# Patient Record
Sex: Female | Born: 1950 | Race: White | Hispanic: No | Marital: Married | Smoking: Never smoker
Health system: Southern US, Community
[De-identification: ages and names within clinical notes are randomized; demographics above are authoritative.]

## PROBLEM LIST (undated history)

## (undated) DIAGNOSIS — L409 Psoriasis, unspecified: Secondary | ICD-10-CM

## (undated) DIAGNOSIS — E0500 Thyrotoxicosis with diffuse goiter without thyrotoxic crisis or storm: Secondary | ICD-10-CM

## (undated) HISTORY — PX: BREAST EXCISIONAL BIOPSY: SUR124

## (undated) HISTORY — DX: Thyrotoxicosis with diffuse goiter without thyrotoxic crisis or storm: E05.00

---

## 1999-06-09 ENCOUNTER — Other Ambulatory Visit: Admission: RE | Admit: 1999-06-09 | Discharge: 1999-06-09 | Payer: Self-pay | Admitting: Obstetrics and Gynecology

## 2000-01-06 ENCOUNTER — Encounter: Payer: Self-pay | Admitting: Obstetrics and Gynecology

## 2000-01-06 ENCOUNTER — Encounter: Admission: RE | Admit: 2000-01-06 | Discharge: 2000-01-06 | Payer: Self-pay | Admitting: Obstetrics and Gynecology

## 2001-01-10 ENCOUNTER — Encounter: Payer: Self-pay | Admitting: Family Medicine

## 2001-01-10 ENCOUNTER — Encounter: Admission: RE | Admit: 2001-01-10 | Discharge: 2001-01-10 | Payer: Self-pay | Admitting: Family Medicine

## 2001-12-20 ENCOUNTER — Encounter (INDEPENDENT_AMBULATORY_CARE_PROVIDER_SITE_OTHER): Payer: Self-pay | Admitting: Specialist

## 2001-12-20 ENCOUNTER — Ambulatory Visit (HOSPITAL_COMMUNITY): Admission: RE | Admit: 2001-12-20 | Discharge: 2001-12-20 | Payer: Self-pay | Admitting: *Deleted

## 2002-01-17 ENCOUNTER — Encounter: Payer: Self-pay | Admitting: Family Medicine

## 2002-01-17 ENCOUNTER — Encounter: Admission: RE | Admit: 2002-01-17 | Discharge: 2002-01-17 | Payer: Self-pay | Admitting: Family Medicine

## 2003-01-30 ENCOUNTER — Encounter: Payer: Self-pay | Admitting: Family Medicine

## 2003-01-30 ENCOUNTER — Encounter: Admission: RE | Admit: 2003-01-30 | Discharge: 2003-01-30 | Payer: Self-pay | Admitting: Family Medicine

## 2004-02-05 ENCOUNTER — Encounter: Admission: RE | Admit: 2004-02-05 | Discharge: 2004-02-05 | Payer: Self-pay | Admitting: Family Medicine

## 2004-07-12 ENCOUNTER — Inpatient Hospital Stay (HOSPITAL_COMMUNITY): Admission: AD | Admit: 2004-07-12 | Discharge: 2004-07-14 | Payer: Self-pay | Admitting: Family Medicine

## 2004-07-29 ENCOUNTER — Encounter: Admission: RE | Admit: 2004-07-29 | Discharge: 2004-10-27 | Payer: Self-pay | Admitting: Obstetrics and Gynecology

## 2005-07-21 ENCOUNTER — Encounter: Admission: RE | Admit: 2005-07-21 | Discharge: 2005-07-21 | Payer: Self-pay | Admitting: Family Medicine

## 2006-07-27 ENCOUNTER — Encounter: Admission: RE | Admit: 2006-07-27 | Discharge: 2006-07-27 | Payer: Self-pay | Admitting: Family Medicine

## 2007-08-23 ENCOUNTER — Encounter: Admission: RE | Admit: 2007-08-23 | Discharge: 2007-08-23 | Payer: Self-pay | Admitting: Family Medicine

## 2008-07-22 ENCOUNTER — Encounter: Admission: RE | Admit: 2008-07-22 | Discharge: 2008-07-22 | Payer: Self-pay | Admitting: Family Medicine

## 2008-10-10 ENCOUNTER — Encounter: Admission: RE | Admit: 2008-10-10 | Discharge: 2008-10-10 | Payer: Self-pay | Admitting: Family Medicine

## 2009-07-27 ENCOUNTER — Encounter: Admission: RE | Admit: 2009-07-27 | Discharge: 2009-07-27 | Payer: Self-pay | Admitting: Family Medicine

## 2010-07-29 ENCOUNTER — Encounter: Admission: RE | Admit: 2010-07-29 | Discharge: 2010-07-29 | Payer: Self-pay | Admitting: Family Medicine

## 2011-03-11 NOTE — Discharge Summary (Signed)
NAMECERIA, Dana Munoz              ACCOUNT NO.:  [id number]   MEDICAL RECORD NO.:  [id number]          PATIENT TYPE:  INP   LOCATION:  5020                         FACILITY:  [facility]   PHYSICIAN:  Hollice Espy, M.D.DATE OF BIRTH:  05-06-1951   DATE OF ADMISSION:  07/12/2004  DATE OF DISCHARGE:  07/14/2004                                 DISCHARGE SUMMARY   PRIMARY CARE PHYSICIAN:  Caryn Bee L. Little, M.D.   DISCHARGE DIAGNOSES:  1.  Right lower lobe and right upper lobe pneumonia.  2.  Fatty liver.  3.  History of irritable bowel syndrome.  4.  Psoriasis.   DISCHARGE MEDICATIONS:  Avelox 400 mg p.o. daily.   HISTORY OF PRESENT ILLNESS:  The patient is a 60 year old white female with  past medical history of psoriasis who came into see her primary care  physician on July 12, 2004 after having problems with nausea and  vomiting.  Previously for the few days prior, she had been complaining of  feeling feverish with temperature which noted to be 101 she took herself.  Originally she had been just diagnosed with possibly sinus or allergy-  related problems with associated nausea and vomiting.  However, by July 12, 2004 after evaluation by Dr. Fredirick Maudlin covering partner, Dr. Tenny Craw, who  felt the patient had a pneumonia.  She was noted to have crackles on her  right lower lobe and upper lobe.   HOSPITAL COURSE:  The patient was brought into the emergency room.  A chest  x-ray was done which did indeed confirm the presence of pneumonia.  She was  started on IV antibiotics.  In addition, she was noted to be dehydrated with  a sodium of 129 and potassium at 3.2.  An ABG done noted a PO2 of 60%.  The  rest of her ABG was normal.  The patient was felt to be hypoxic secondary to  her pneumonia.  She received oxygen, breathing treatments, antibiotics, and  IV fluid.  She responded very well and by July 13, 2004 was starting to  feel much better.  Her only complaint was some  shakiness overall after  getting a breathing treatment.  She was able to be changed over to p.o.  antibiotics on July 13, 2004 and she was able to start standing on her  own without any dizziness.  By July 14, 2004 she was able to ambulate  well, tolerate p.o.  A repeat ABG showed a PO2 had improved up to 80%.  At  this time it was felt that she was stable enough to be discharged home.   She will continue on five more days antibiotics for a total of seven days.  At the time she will follow up with her PCP and at his discretion, a repeat  chest x-ray may be obtained.  In addition, the patient's saturations are  above 95% on room air.   In regard to her fatty liver, she was complaining of some right upper  quadrant pain which she said was worse when eating.  She is status post  cholecystectomy.  A CT  scan of her abdomen and pelvis done was remarkable  only for a diffuse fatty liver.  Initially when the patient came to the  emergency room, she was noted to have an elevated AST and ALT of 51 and 57.  A repeat AST and ALT on July 14, 2004 had slightly increased to 74 and  73, but given the patient's absence of pain and no other symptoms, this may  be a transient inflammation secondary to her pneumonia and likely bears  followup.  The patient is advised to decrease her alcohol intake.  She tells  me that she drinks occasionally on weekends but not every weekend, no more  than two or three drinks.  I advised the patient that she needs to decrease  even this alcohol intake as well as go on more of a low-fat diet for weight  loss given that the risks of fatty liver can lead to long term possible  hepatitis.  The patient understands this and she tells me she will indeed be  compliant.   She is being discharged to home.  Her disposition has improved.  Her  activity is as tolerated.  Her diet is recommended to be a low-fat diet.  The patient will follow up with her PCP, Dr. Clarene Duke,  in one week's time.      Send   SKK/MEDQ  D:  07/14/2004  T:  07/15/2004  Job:  [id number]   cc:   Caryn Bee L. Little, M.D.  [address]  [place]  [place] [address]  Fax: [phone]

## 2011-03-11 NOTE — H&P (Signed)
NAMEMERON, BOCCHINO              ACCOUNT NO.:  [id number]   MEDICAL RECORD NO.:  [id number]          PATIENT TYPE:  INP   LOCATION:  5020                         FACILITY:  [facility]   PHYSICIAN:  Melissa L. Ladona Ridgel, MD  DATE OF BIRTH:  Jun 26, 1951   DATE OF ADMISSION:  07/12/2004  DATE OF DISCHARGE:                                HISTORY & PHYSICAL   CHIEF COMPLAINT:  Nausea, vomiting and fever.   PRIMARY CARE PHYSICIAN:  Caryn Bee L. Little, M.D.   HISTORY OF PRESENT ILLNESS:  The patient is a 60 year old white female who  states that on Thursday she started to feel feverish.  She took her  temperature and found it to be subtherapeutic at 97.5 degrees.  She treated  herself with rest and by Friday again felt hot with chills and found that  she had a temperature in the 101.0 range for which she took Tylenol.  She  continued to have fever and bone pain as well as muscles aches and pain for  which she was alternating Tylenol and Motrin.  By Sunday, she was nauseous  and vomiting and so she went to the walk-in clinic.  There she was given an  unknown liquid to drink which did not help her symptoms and by Monday she  continued to have severe body aches, chills, nausea and vomiting so much so  that she could not even keep ginger ale down.  She went to the primary care  physician's office and was seen by Dr. Tenny Craw who gave her a sample of Ketek  and asked her to go home and rest.  The patient went home and took her first  dose of Ketek and started to vomit terribly.  Because of her inability to  keep any medications down and her persistent fever, the patient was admitted  directly to the medical floor for treatment of dehydration and probable  pneumonia.  The patient gives a history of her spouse recently returning  from a five week trip in [place] where he in and out of factory settings  amongst the people.  He received different types of advisories for potential  exposures including meningitis,  but states that he was not in any way in  contact with animals as part of his work.  He has presently been back home  about three weeks. The only  sick contacts that the patient had exposure to  was her grandchildren.   REVIEW OF SYMPTOMS:  GENERAL:  Fevers, body aches, headache and negative  cough. HEENT: Positive sinus drainage. All other review of systems are  negative including no dysuria, no melena and no hematochezia.   PAST MEDICAL HISTORY:  1.  Psoriasis.  2.  Occasional irritable bowel symptoms.   PAST SURGICAL HISTORY:  1.  Hysterectomy.  2.  Several lumpectomies on both breasts.  3.  Gallbladder has been removed.  4.  Basal cell carcinoma removed from her back and her arm.  5.  Toenails removed in the past.   ALLERGIES:  1.  PENICILLIN.  2.  NEOSPORIN.  3.  MACRODANTIN.   MEDICATIONS:  1.  Black cohosh.  2.  Fish oil.  3.  Recently started lactobacillus to try to help with her irritable bowel      syndrome.   SOCIAL HISTORY:  She quit tobacco probably greater than 20 years ago.  She  does drink approximately several times a week and that consists of several  vodkas.  She denies any illicit drug abuse.  She is an Print production planner for a  dentist.  She has one step-son with grandchildren.  She is married.   FAMILY HISTORY:  Mom is living with hypertension and hip replacement.  Her  father is deceased secondary to suicide.  She is not aware of any medical  problems.  She also related some dizziness.   PHYSICAL EXAMINATION:  VITAL SIGNS:  Temperature is 103.6, blood pressure  146/73, initial heart rate was 106 but on my exam is about 96, respiratory  rate 22 on admission and appears to be about 18 for me, saturations are 96%  on room air.  GENERAL:  This is an ill appearing white female.  HEENT:  Normocephalic, atraumatic.  Pupils equal, round and reactive to  light.  Extraocular movements are intact.  Mucous membranes are dry. The  neck is supple.  There is no JVD.   No lymph nodes.  No carotid bruits.  Her  TM reveal serous otitis. She does have psoriatic changes to the posterior  auricular area.  CHEST:  Right mid axillary crackles, but no wheezes.  CARDIOVASCULAR:  Regular rate and rhythm with positive S1 and S2.  No S3 or  S4.  No murmurs, rubs or gallops.  ABDOMEN:  Soft, non-tender and non-distended with positive bowel sounds.  EXTREMITIES:  No cyanosis, clubbing or edema.  She does have psoriatic  plaques to the backs of her elbows.  NEUROLOGIC:  She is nonfocal. Power is 4/5 and symmetrical.  Deep tendon  reflexes are 2+.  Cranial nerves appear to be intact.   LABORATORY DATA:  Pending.   X-ray is pending.   ASSESSMENT AND PLAN:  This is a 60 year old white female with several days  of fever, chills, nausea and vomiting progressing on to a cough with  greenish sputum.  She has been admitted to the hospital for failure of  outpatient therapy secondary to intractable nausea preventing her from  taking pills.  1.  Pulmonary.  We will check her chest x-ray and sinus x-rays. I agree with      ceftriaxone and Azithromycin IV at this time since she is not taking      pills very well. I will add nebulizers and a flutter valve and check an      arterial blood gas to be sure she is not hypoxic.  2.  Cardiovascular.  She arrived mildly tachycardic, but appears to be      settling down. I suspect this is secondary to dehydration. Her dizziness      may also be orthostasis, so we will check orthostatic blood pressures.  3.  Gastrointestinal.  We will treat her nausea and vomiting with Phenergan      and Zofran and add Protonix. She can have clears and we will advance as      tolerated her diet.  4.  Genito-urinary.  We will check a urinalysis, culture and sensitivity.  5.  Deep venous thrombosis prophylaxis. At this point, the patient is able     to ambulate and we will therefore use ambulation and PAS hose for deep  venous thrombosis  prophylaxis.  If she seems to be more in bed than      ambulating, then we will switch to Lovenox.        MLT/MEDQ  D:  07/12/2004  T:  07/12/2004  Job:  [id number]   cc:   Caryn Bee L. Little, M.D.  [address]  [place]  [place] [address]  Fax: [phone]

## 2011-06-23 ENCOUNTER — Other Ambulatory Visit: Payer: Self-pay | Admitting: Family Medicine

## 2011-06-23 DIAGNOSIS — Z1231 Encounter for screening mammogram for malignant neoplasm of breast: Secondary | ICD-10-CM

## 2011-08-02 ENCOUNTER — Ambulatory Visit
Admission: RE | Admit: 2011-08-02 | Discharge: 2011-08-02 | Disposition: A | Discharge status: Home / Self Care | Payer: 59 | Source: Ambulatory Visit | Attending: Family Medicine | Admitting: Family Medicine

## 2011-08-02 DIAGNOSIS — Z1231 Encounter for screening mammogram for malignant neoplasm of breast: Secondary | ICD-10-CM

## 2012-06-26 ENCOUNTER — Other Ambulatory Visit: Payer: Self-pay | Admitting: Family Medicine

## 2012-06-26 DIAGNOSIS — Z1231 Encounter for screening mammogram for malignant neoplasm of breast: Secondary | ICD-10-CM

## 2012-08-21 ENCOUNTER — Ambulatory Visit
Admission: RE | Admit: 2012-08-21 | Discharge: 2012-08-21 | Disposition: A | Discharge status: Home / Self Care | Payer: 59 | Source: Ambulatory Visit | Attending: Family Medicine | Admitting: Family Medicine

## 2012-08-21 DIAGNOSIS — Z1231 Encounter for screening mammogram for malignant neoplasm of breast: Secondary | ICD-10-CM

## 2013-07-24 ENCOUNTER — Other Ambulatory Visit: Payer: Self-pay

## 2013-07-24 DIAGNOSIS — Z1231 Encounter for screening mammogram for malignant neoplasm of breast: Secondary | ICD-10-CM

## 2013-08-22 ENCOUNTER — Ambulatory Visit
Admission: RE | Admit: 2013-08-22 | Discharge: 2013-08-22 | Disposition: A | Discharge status: Home / Self Care | Payer: 59 | Source: Ambulatory Visit

## 2013-08-22 DIAGNOSIS — Z1231 Encounter for screening mammogram for malignant neoplasm of breast: Secondary | ICD-10-CM

## 2013-10-31 ENCOUNTER — Other Ambulatory Visit (HOSPITAL_COMMUNITY): Payer: Self-pay | Admitting: Internal Medicine

## 2013-10-31 DIAGNOSIS — E041 Nontoxic single thyroid nodule: Secondary | ICD-10-CM

## 2013-10-31 DIAGNOSIS — E0590 Thyrotoxicosis, unspecified without thyrotoxic crisis or storm: Secondary | ICD-10-CM

## 2013-11-14 ENCOUNTER — Encounter (HOSPITAL_COMMUNITY)
Admission: RE | Admit: 2013-11-14 | Discharge: 2013-11-14 | Disposition: A | Discharge status: Home / Self Care | Payer: 59 | Source: Ambulatory Visit | Attending: Internal Medicine | Admitting: Internal Medicine

## 2013-11-14 DIAGNOSIS — E041 Nontoxic single thyroid nodule: Secondary | ICD-10-CM

## 2013-11-14 DIAGNOSIS — E0590 Thyrotoxicosis, unspecified without thyrotoxic crisis or storm: Secondary | ICD-10-CM | POA: Insufficient documentation

## 2013-11-15 ENCOUNTER — Encounter (HOSPITAL_COMMUNITY)
Admission: RE | Admit: 2013-11-15 | Discharge: 2013-11-15 | Disposition: A | Discharge status: Home / Self Care | Payer: 59 | Source: Ambulatory Visit | Attending: Internal Medicine | Admitting: Internal Medicine

## 2013-11-15 MED ORDER — SODIUM PERTECHNETATE TC 99M INJECTION
11.0000 | Freq: Once | INTRAVENOUS | Status: AC | PRN
  Administered 2013-11-15: 11 via INTRAVENOUS

## 2013-11-15 MED ORDER — SODIUM IODIDE I 131 CAPSULE
6.9000 | Freq: Once | INTRAVENOUS | Status: AC | PRN
  Administered 2013-11-15: 6.9 via ORAL

## 2013-11-25 ENCOUNTER — Other Ambulatory Visit: Payer: Self-pay | Admitting: Internal Medicine

## 2013-11-25 DIAGNOSIS — E0590 Thyrotoxicosis, unspecified without thyrotoxic crisis or storm: Secondary | ICD-10-CM

## 2013-11-25 DIAGNOSIS — E041 Nontoxic single thyroid nodule: Secondary | ICD-10-CM

## 2013-11-25 DIAGNOSIS — E0500 Thyrotoxicosis with diffuse goiter without thyrotoxic crisis or storm: Secondary | ICD-10-CM

## 2013-11-26 ENCOUNTER — Ambulatory Visit
Admission: RE | Admit: 2013-11-26 | Discharge: 2013-11-26 | Disposition: A | Discharge status: Home / Self Care | Payer: 59 | Source: Ambulatory Visit | Attending: Internal Medicine | Admitting: Internal Medicine

## 2013-11-26 ENCOUNTER — Other Ambulatory Visit (HOSPITAL_COMMUNITY)
Admission: RE | Admit: 2013-11-26 | Discharge: 2013-11-26 | Disposition: A | Discharge status: Home / Self Care | Payer: 59 | Source: Ambulatory Visit | Attending: Interventional Radiology | Admitting: Interventional Radiology

## 2013-11-26 DIAGNOSIS — E0500 Thyrotoxicosis with diffuse goiter without thyrotoxic crisis or storm: Secondary | ICD-10-CM

## 2013-11-26 DIAGNOSIS — E041 Nontoxic single thyroid nodule: Secondary | ICD-10-CM

## 2013-11-26 DIAGNOSIS — E049 Nontoxic goiter, unspecified: Secondary | ICD-10-CM | POA: Insufficient documentation

## 2013-11-26 DIAGNOSIS — E0590 Thyrotoxicosis, unspecified without thyrotoxic crisis or storm: Secondary | ICD-10-CM

## 2014-07-30 ENCOUNTER — Other Ambulatory Visit: Payer: Self-pay

## 2014-07-30 DIAGNOSIS — Z1231 Encounter for screening mammogram for malignant neoplasm of breast: Secondary | ICD-10-CM

## 2014-09-04 ENCOUNTER — Ambulatory Visit
Admission: RE | Admit: 2014-09-04 | Discharge: 2014-09-04 | Disposition: A | Discharge status: Home / Self Care | Payer: 59 | Source: Ambulatory Visit

## 2014-09-04 DIAGNOSIS — Z1231 Encounter for screening mammogram for malignant neoplasm of breast: Secondary | ICD-10-CM

## 2015-07-15 ENCOUNTER — Other Ambulatory Visit: Payer: Self-pay

## 2015-07-15 DIAGNOSIS — Z1231 Encounter for screening mammogram for malignant neoplasm of breast: Secondary | ICD-10-CM

## 2015-09-07 ENCOUNTER — Ambulatory Visit
Admission: RE | Admit: 2015-09-07 | Discharge: 2015-09-07 | Disposition: A | Discharge status: Home / Self Care | Payer: BLUE CROSS/BLUE SHIELD | Source: Ambulatory Visit

## 2015-09-07 DIAGNOSIS — Z1231 Encounter for screening mammogram for malignant neoplasm of breast: Secondary | ICD-10-CM

## 2016-08-10 ENCOUNTER — Other Ambulatory Visit: Payer: Self-pay | Admitting: Family Medicine

## 2016-08-10 DIAGNOSIS — Z1231 Encounter for screening mammogram for malignant neoplasm of breast: Secondary | ICD-10-CM

## 2016-09-20 ENCOUNTER — Ambulatory Visit
Admission: RE | Admit: 2016-09-20 | Discharge: 2016-09-20 | Disposition: A | Discharge status: Home / Self Care | Payer: Medicare Other | Source: Ambulatory Visit | Attending: Family Medicine | Admitting: Family Medicine

## 2016-09-20 DIAGNOSIS — Z1231 Encounter for screening mammogram for malignant neoplasm of breast: Secondary | ICD-10-CM | POA: Diagnosis not present

## 2016-11-18 DIAGNOSIS — Z6836 Body mass index (BMI) 36.0-36.9, adult: Secondary | ICD-10-CM | POA: Diagnosis not present

## 2016-11-18 DIAGNOSIS — E785 Hyperlipidemia, unspecified: Secondary | ICD-10-CM | POA: Diagnosis not present

## 2016-11-18 DIAGNOSIS — E0500 Thyrotoxicosis with diffuse goiter without thyrotoxic crisis or storm: Secondary | ICD-10-CM | POA: Diagnosis not present

## 2016-11-18 DIAGNOSIS — Z23 Encounter for immunization: Secondary | ICD-10-CM | POA: Diagnosis not present

## 2016-11-18 DIAGNOSIS — M79671 Pain in right foot: Secondary | ICD-10-CM | POA: Diagnosis not present

## 2016-11-18 DIAGNOSIS — Z79899 Other long term (current) drug therapy: Secondary | ICD-10-CM | POA: Diagnosis not present

## 2016-11-18 DIAGNOSIS — R8290 Unspecified abnormal findings in urine: Secondary | ICD-10-CM | POA: Diagnosis not present

## 2016-11-18 DIAGNOSIS — M25551 Pain in right hip: Secondary | ICD-10-CM | POA: Diagnosis not present

## 2016-11-18 DIAGNOSIS — R7301 Impaired fasting glucose: Secondary | ICD-10-CM | POA: Diagnosis not present

## 2016-11-18 DIAGNOSIS — L409 Psoriasis, unspecified: Secondary | ICD-10-CM | POA: Diagnosis not present

## 2016-11-18 DIAGNOSIS — Z0000 Encounter for general adult medical examination without abnormal findings: Secondary | ICD-10-CM | POA: Diagnosis not present

## 2016-11-18 DIAGNOSIS — E669 Obesity, unspecified: Secondary | ICD-10-CM | POA: Diagnosis not present

## 2016-11-24 DIAGNOSIS — L400 Psoriasis vulgaris: Secondary | ICD-10-CM | POA: Diagnosis not present

## 2016-11-24 DIAGNOSIS — D1730 Benign lipomatous neoplasm of skin and subcutaneous tissue of unspecified sites: Secondary | ICD-10-CM | POA: Diagnosis not present

## 2016-11-25 DIAGNOSIS — I781 Nevus, non-neoplastic: Secondary | ICD-10-CM | POA: Diagnosis not present

## 2016-12-02 DIAGNOSIS — H40013 Open angle with borderline findings, low risk, bilateral: Secondary | ICD-10-CM | POA: Diagnosis not present

## 2016-12-02 DIAGNOSIS — H52223 Regular astigmatism, bilateral: Secondary | ICD-10-CM | POA: Diagnosis not present

## 2016-12-02 DIAGNOSIS — H11441 Conjunctival cysts, right eye: Secondary | ICD-10-CM | POA: Diagnosis not present

## 2016-12-02 DIAGNOSIS — H5203 Hypermetropia, bilateral: Secondary | ICD-10-CM | POA: Diagnosis not present

## 2016-12-02 DIAGNOSIS — H40053 Ocular hypertension, bilateral: Secondary | ICD-10-CM | POA: Diagnosis not present

## 2016-12-30 DIAGNOSIS — M79671 Pain in right foot: Secondary | ICD-10-CM | POA: Diagnosis not present

## 2016-12-30 DIAGNOSIS — G8929 Other chronic pain: Secondary | ICD-10-CM | POA: Diagnosis not present

## 2017-03-14 ENCOUNTER — Emergency Department (HOSPITAL_COMMUNITY): Payer: Medicare Other

## 2017-03-14 ENCOUNTER — Emergency Department (HOSPITAL_COMMUNITY)
Admission: EM | Admit: 2017-03-14 | Discharge: 2017-03-14 | Disposition: A | Discharge status: Home / Self Care | Payer: Medicare Other | Attending: Emergency Medicine | Admitting: Emergency Medicine

## 2017-03-14 ENCOUNTER — Encounter (HOSPITAL_COMMUNITY): Payer: Self-pay | Admitting: Emergency Medicine

## 2017-03-14 DIAGNOSIS — K5732 Diverticulitis of large intestine without perforation or abscess without bleeding: Secondary | ICD-10-CM | POA: Insufficient documentation

## 2017-03-14 DIAGNOSIS — Z7982 Long term (current) use of aspirin: Secondary | ICD-10-CM | POA: Insufficient documentation

## 2017-03-14 DIAGNOSIS — R1032 Left lower quadrant pain: Secondary | ICD-10-CM | POA: Diagnosis present

## 2017-03-14 DIAGNOSIS — K5792 Diverticulitis of intestine, part unspecified, without perforation or abscess without bleeding: Secondary | ICD-10-CM

## 2017-03-14 HISTORY — DX: Psoriasis, unspecified: L40.9

## 2017-03-14 LAB — URINALYSIS, ROUTINE W REFLEX MICROSCOPIC
BILIRUBIN URINE: NEGATIVE
Glucose, UA: NEGATIVE mg/dL
Hgb urine dipstick: NEGATIVE
Ketones, ur: NEGATIVE mg/dL
NITRITE: NEGATIVE
PH: 7 (ref 5.0–8.0)
Protein, ur: NEGATIVE mg/dL
SPECIFIC GRAVITY, URINE: 1.009 (ref 1.005–1.030)

## 2017-03-14 LAB — CBC WITH DIFFERENTIAL/PLATELET
BASOS PCT: 0 %
Basophils Absolute: 0 10*3/uL (ref 0.0–0.1)
Eosinophils Absolute: 0.1 10*3/uL (ref 0.0–0.7)
Eosinophils Relative: 1 %
HEMATOCRIT: 39.8 % (ref 36.0–46.0)
Hemoglobin: 13.4 g/dL (ref 12.0–15.0)
Lymphocytes Relative: 10 %
Lymphs Abs: 0.9 10*3/uL (ref 0.7–4.0)
MCH: 29.3 pg (ref 26.0–34.0)
MCHC: 33.7 g/dL (ref 30.0–36.0)
MCV: 86.9 fL (ref 78.0–100.0)
MONO ABS: 0.8 10*3/uL (ref 0.1–1.0)
MONOS PCT: 9 %
NEUTROS ABS: 7 10*3/uL (ref 1.7–7.7)
Neutrophils Relative %: 80 %
Platelets: 177 10*3/uL (ref 150–400)
RBC: 4.58 MIL/uL (ref 3.87–5.11)
RDW: 12.8 % (ref 11.5–15.5)
WBC: 8.7 10*3/uL (ref 4.0–10.5)

## 2017-03-14 LAB — COMPREHENSIVE METABOLIC PANEL
ALT: 17 U/L (ref 14–54)
AST: 18 U/L (ref 15–41)
Albumin: 3.6 g/dL (ref 3.5–5.0)
Alkaline Phosphatase: 94 U/L (ref 38–126)
Anion gap: 10 (ref 5–15)
BILIRUBIN TOTAL: 2.8 mg/dL — AB (ref 0.3–1.2)
BUN: 10 mg/dL (ref 6–20)
CO2: 26 mmol/L (ref 22–32)
Calcium: 9.4 mg/dL (ref 8.9–10.3)
Chloride: 103 mmol/L (ref 101–111)
Creatinine, Ser: 0.67 mg/dL (ref 0.44–1.00)
GFR calc non Af Amer: >60 mL/min (ref >60)
Glucose, Bld: 116 mg/dL — ABNORMAL HIGH (ref 65–99)
POTASSIUM: 3.8 mmol/L (ref 3.5–5.1)
Sodium: 139 mmol/L (ref 135–145)
TOTAL PROTEIN: 7 g/dL (ref 6.5–8.1)

## 2017-03-14 LAB — LIPASE, BLOOD: Lipase: 17 U/L (ref 11–51)

## 2017-03-14 MED ORDER — IOPAMIDOL (ISOVUE-300) INJECTION 61%
INTRAVENOUS | Status: AC
  Administered 2017-03-14: 100 mL
  Filled 2017-03-14: qty 100

## 2017-03-14 MED ORDER — METRONIDAZOLE 500 MG PO TABS
500.0000 mg | ORAL_TABLET | Freq: Once | ORAL | Status: AC
  Administered 2017-03-14: 500 mg via ORAL
  Filled 2017-03-14: qty 1

## 2017-03-14 MED ORDER — ONDANSETRON 4 MG PO TBDP: 4.0000 mg | ORAL_TABLET | Freq: Three times a day (TID) | ORAL | 0 refills | Status: DC | PRN

## 2017-03-14 MED ORDER — CIPROFLOXACIN HCL 500 MG PO TABS
500.0000 mg | ORAL_TABLET | Freq: Once | ORAL | Status: AC
  Administered 2017-03-14: 500 mg via ORAL
  Filled 2017-03-14: qty 1

## 2017-03-14 MED ORDER — METRONIDAZOLE 500 MG PO TABS: 500.0000 mg | ORAL_TABLET | Freq: Two times a day (BID) | ORAL | 0 refills | Status: AC

## 2017-03-14 MED ORDER — MORPHINE SULFATE (PF) 4 MG/ML IV SOLN: 4.0000 mg | Freq: Once | INTRAVENOUS | Status: DC

## 2017-03-14 MED ORDER — SODIUM CHLORIDE 0.9 % IV BOLUS (SEPSIS)
1000.0000 mL | Freq: Once | INTRAVENOUS | Status: AC
  Administered 2017-03-14: 1000 mL via INTRAVENOUS

## 2017-03-14 MED ORDER — HYDROCODONE-ACETAMINOPHEN 5-325 MG PO TABS: 1.0000 | ORAL_TABLET | ORAL | 0 refills | Status: DC | PRN

## 2017-03-14 MED ORDER — ONDANSETRON HCL 4 MG/2ML IJ SOLN: 4.0000 mg | Freq: Once | INTRAMUSCULAR | Status: DC

## 2017-03-14 MED ORDER — CIPROFLOXACIN HCL 500 MG PO TABS: 500.0000 mg | ORAL_TABLET | Freq: Two times a day (BID) | ORAL | 0 refills | Status: AC

## 2017-03-14 NOTE — ED Provider Notes (Signed)
[facility] DEPT Provider Note   CSN: [id number] Arrival date & time: 03/14/17  6440     History   Chief Complaint Chief Complaint  Patient presents with  . Abdominal Pain    HPI Dana Munoz is a 66 y.o. female.  HPI   Pt is 66 year old female with history of psoriasis and diverticulosis who presents to the ED with complaint of left lower abdominal pain, onset last night. Patient reports around 10 PM last night when she was getting ready for bed she began to have constant aching pain to her left lower quadrant with intermittent sharp pain. She notes the pain is worse with movement or when laying on her side or applying pressure. She reports having a few episodes of nonbloody diarrhea 2 days ago and one episode yesterday but reports having intermittent diarrhea at baseline. Denies taking any medications for her symptoms prior to arrival. Denies fever, chills, chest pain, shortness of breath, nausea, vomiting, urinary symptoms, blood in urine or stool, vaginal bleeding or discharge. Patient denies history of abdominal surgeries.  Past Medical History:  Diagnosis Date  . Psoriasis     There are no active problems to display for this patient.   History reviewed. No pertinent surgical history.  OB History    No data available       Home Medications    Prior to Admission medications   Medication Sig Start Date End Date Taking? Authorizing Provider  aspirin 81 MG chewable tablet Chew 81 mg by mouth daily.   Yes [provider]  cholecalciferol (VITAMIN D) 1000 units tablet Take 1,000 Units by mouth daily.   Yes [provider]  diphenhydrAMINE (BENADRYL) 25 MG tablet Take 25 mg by mouth every 6 (six) hours as needed for itching.   Yes [provider]  loratadine (CLARITIN) 10 MG tablet Take 10 mg by mouth daily.   Yes [provider]  naproxen sodium (ALEVE) 220 MG tablet Take 220 mg by mouth daily as needed. pain   Yes [provider]  ciprofloxacin (CIPRO) 500 MG tablet Take 1 tablet (500 mg total) by mouth 2 (two) times daily. 03/14/17 03/24/17  Nona Dell, PA-C  HYDROcodone-acetaminophen (NORCO/VICODIN) 5-325 MG tablet Take 1 tablet by mouth every 4 (four) hours as needed. 03/14/17   Nona Dell, PA-C  metroNIDAZOLE (FLAGYL) 500 MG tablet Take 1 tablet (500 mg total) by mouth 2 (two) times daily. 03/14/17 03/24/17  Nona Dell, PA-C  ondansetron (ZOFRAN ODT) 4 MG disintegrating tablet Take 1 tablet (4 mg total) by mouth every 8 (eight) hours as needed for nausea or vomiting. 03/14/17   Nona Dell, PA-C    Family History History reviewed. No pertinent family history.  Social History Social History  Substance Use Topics  . Smoking status: Never Smoker  . Smokeless tobacco: Never Used  . Alcohol use No     Allergies   Neosporin [neomycin-bacitracin zn-polymyx] and Penicillins   Review of Systems Review of Systems  Gastrointestinal: Positive for abdominal pain and diarrhea.  All other systems reviewed and are negative.    Physical Exam Updated Vital Signs BP (!) 143/61   Pulse 77   Temp 98.9 F (37.2 C) (Oral)   Resp 16   SpO2 98%   Physical Exam  Constitutional: She is oriented to person, place, and time. She appears well-developed and well-nourished. No distress.  HENT:  Head: Normocephalic and atraumatic.  Mouth/Throat: Uvula is midline, oropharynx is clear and  moist and mucous membranes are normal. No oropharyngeal exudate, posterior oropharyngeal edema, posterior oropharyngeal erythema or tonsillar abscesses. No tonsillar exudate.  Eyes: Conjunctivae and EOM are normal. Right eye exhibits no discharge. Left eye exhibits no discharge. No scleral icterus.  Neck: Normal range of motion. Neck supple.  Cardiovascular: Normal rate, regular rhythm, normal heart sounds and intact distal pulses.   Pulmonary/Chest: Effort normal and breath sounds  normal.  Abdominal: Soft. Normal appearance and bowel sounds are normal. She exhibits no distension and no mass. There is tenderness in the left lower quadrant. There is no rigidity, no rebound, no guarding and no CVA tenderness.  TTP over left side of abdomen and periumbilical region, worse over LLQ.  Musculoskeletal: She exhibits no edema.  Lymphadenopathy:    She has no cervical adenopathy.  Neurological: She is alert and oriented to person, place, and time.  Skin: Skin is warm and dry. She is not diaphoretic.  Nursing note and vitals reviewed.    ED Treatments / Results  Labs (all labs ordered are listed, but only abnormal results are displayed) Labs Reviewed  COMPREHENSIVE METABOLIC PANEL - Abnormal; Notable for the following:       Result Value   Glucose, Bld 116 (*)    Total Bilirubin 2.8 (*)    All other components within normal limits  URINALYSIS, ROUTINE W REFLEX MICROSCOPIC - Abnormal; Notable for the following:    Leukocytes, UA TRACE (*)    Bacteria, UA RARE (*)    Squamous Epithelial / LPF 0-5 (*)    All other components within normal limits  CBC WITH DIFFERENTIAL/PLATELET  LIPASE, BLOOD    EKG  EKG Interpretation None       Radiology Ct Abdomen Pelvis W Contrast  Result Date: 03/14/2017 CLINICAL DATA:  Left lower quadrant pain EXAM: CT ABDOMEN AND PELVIS WITH CONTRAST TECHNIQUE: Multidetector CT imaging of the abdomen and pelvis was performed using the standard protocol following bolus administration of intravenous contrast. CONTRAST:  165mL ISOVUE-300 IOPAMIDOL (ISOVUE-300) INJECTION 61% COMPARISON:  None. FINDINGS: Lower chest: Lung bases clear Hepatobiliary: Cholecystectomy.  Normal liver and bile ducts Pancreas: Negative Spleen: Negative Adrenals/Urinary Tract: Negative for renal mass or obstruction. Small parapelvic cysts bilaterally. No renal calculi. Normal urinary bladder. Stomach/Bowel: Normal stomach. Small bowel nondilated. Colon nondilated Thickening  of the sigmoid colon with surrounding edema in the mesentery. Associated diverticular changes are present. Findings consistent with acute diverticulitis. Negative for abscess. Negative for free air or free fluid. Vascular/Lymphatic: Mild atherosclerotic disease without aortic aneurysm. Negative for lymphadenopathy. Reproductive: Hysterectomy.  Negative for pelvic mass. Other: Negative for hernia. Musculoskeletal: No acute skeletal abnormality. IMPRESSION: Sigmoid diverticulitis.  Negative for abscess or free fluid. Cholecystectomy without biliary dilatation. Electronically Signed   By: Franchot Gallo M.D.   On: 03/14/2017 13:23    Procedures Procedures (including critical care time)  Medications Ordered in ED Medications  morphine 4 MG/ML injection 4 mg (0 mg Intravenous Hold 03/14/17 1150)  ondansetron (ZOFRAN) injection 4 mg (0 mg Intravenous Hold 03/14/17 1327)  sodium chloride 0.9 % bolus 1,000 mL (0 mLs Intravenous Stopped 03/14/17 1259)  iopamidol (ISOVUE-300) 61 % injection (100 mLs  Contrast Given 03/14/17 1303)  metroNIDAZOLE (FLAGYL) tablet 500 mg (500 mg Oral Given 03/14/17 1337)  ciprofloxacin (CIPRO) tablet 500 mg (500 mg Oral Given 03/14/17 1337)     Initial Impression / Assessment and Plan / ED Course  I have reviewed the triage vital signs and the nursing notes.  Pertinent labs &  imaging results that were available during my care of the patient were reviewed by me and considered in my medical decision making (see chart for details).     Patient presented with left lower abdominal pain that started yesterday. Reports history of diverticulosis. Denies fever, nausea, vomiting. Reports history of intermittent diarrhea at baseline. VSS. Exam revealed tenderness over left side of abdomen, worse over left lower quadrant, no peritoneal signs. Remaining exam unremarkable. Patient given IV fluids, pain meds. Labs and urine unremarkable. CT showed sigmoid diverticulitis without abscess or free  fluid. On reevaluation patient reports improvement of pain. Patient given an initial dose of antibiotics in the ED. Tolerating PO. Discussed results and plan for discharge with patient. Plan to discharge patient home with 10 day dose of Cipro and Flagyl along with pain meds and antiemetics as needed. Advised patient to follow up with PCP. Discussed return precautions.  Final Clinical Impressions(s) / ED Diagnoses   Final diagnoses:  Diverticulitis    New Prescriptions New Prescriptions   CIPROFLOXACIN (CIPRO) 500 MG TABLET    Take 1 tablet (500 mg total) by mouth 2 (two) times daily.   HYDROCODONE-ACETAMINOPHEN (NORCO/VICODIN) 5-325 MG TABLET    Take 1 tablet by mouth every 4 (four) hours as needed.   METRONIDAZOLE (FLAGYL) 500 MG TABLET    Take 1 tablet (500 mg total) by mouth 2 (two) times daily.   ONDANSETRON (ZOFRAN ODT) 4 MG DISINTEGRATING TABLET    Take 1 tablet (4 mg total) by mouth every 8 (eight) hours as needed for nausea or vomiting.     Nona Dell, PA-C 03/14/17 1432    Gareth Morgan, MD 03/15/17 [phone]

## 2017-03-14 NOTE — ED Notes (Signed)
Patient transported to CT 

## 2017-03-14 NOTE — ED Notes (Signed)
Family at bedside. 

## 2017-03-14 NOTE — ED Notes (Signed)
Pt states she does not want pain medication at this time.  Will hold for later.

## 2017-03-14 NOTE — ED Triage Notes (Signed)
Pt sts LLQ abd pain worse with movement x 2 days; pt sts hx of diverticulosis; pt sts diarrhea yesterday

## 2017-03-14 NOTE — ED Notes (Signed)
Pt given 2 cups of ice water, tolerating well

## 2017-03-14 NOTE — Discharge Instructions (Signed)
Take your antibiotics as prescribed until completed. I recommend follow-up with your primary care provider within the next 3-4 days for follow-up evaluation. Return to the emergency department if symptoms worsen or new onset of fever, chest pain, difficulty breathing, new/worsening abdominal pain, vomiting, blood in emesis or stool.

## 2017-06-28 DIAGNOSIS — Z23 Encounter for immunization: Secondary | ICD-10-CM | POA: Diagnosis not present

## 2017-08-24 ENCOUNTER — Other Ambulatory Visit: Payer: Self-pay | Admitting: Family Medicine

## 2017-08-24 DIAGNOSIS — R1030 Lower abdominal pain, unspecified: Secondary | ICD-10-CM | POA: Diagnosis not present

## 2017-08-24 DIAGNOSIS — Z1231 Encounter for screening mammogram for malignant neoplasm of breast: Secondary | ICD-10-CM

## 2017-08-24 DIAGNOSIS — K5792 Diverticulitis of intestine, part unspecified, without perforation or abscess without bleeding: Secondary | ICD-10-CM | POA: Diagnosis not present

## 2017-09-21 ENCOUNTER — Ambulatory Visit
Admission: RE | Admit: 2017-09-21 | Discharge: 2017-09-21 | Disposition: A | Discharge status: Home / Self Care | Payer: Medicare Other | Source: Ambulatory Visit | Attending: Family Medicine | Admitting: Family Medicine

## 2017-09-21 DIAGNOSIS — Z1231 Encounter for screening mammogram for malignant neoplasm of breast: Secondary | ICD-10-CM | POA: Diagnosis not present

## 2017-12-07 DIAGNOSIS — H40013 Open angle with borderline findings, low risk, bilateral: Secondary | ICD-10-CM | POA: Diagnosis not present

## 2017-12-07 DIAGNOSIS — H5203 Hypermetropia, bilateral: Secondary | ICD-10-CM | POA: Diagnosis not present

## 2017-12-07 DIAGNOSIS — H25813 Combined forms of age-related cataract, bilateral: Secondary | ICD-10-CM | POA: Diagnosis not present

## 2017-12-07 DIAGNOSIS — H52223 Regular astigmatism, bilateral: Secondary | ICD-10-CM | POA: Diagnosis not present

## 2017-12-07 DIAGNOSIS — H11441 Conjunctival cysts, right eye: Secondary | ICD-10-CM | POA: Diagnosis not present

## 2017-12-07 DIAGNOSIS — H11151 Pinguecula, right eye: Secondary | ICD-10-CM | POA: Diagnosis not present

## 2017-12-21 DIAGNOSIS — Z8639 Personal history of other endocrine, nutritional and metabolic disease: Secondary | ICD-10-CM | POA: Diagnosis not present

## 2017-12-21 DIAGNOSIS — Z79899 Other long term (current) drug therapy: Secondary | ICD-10-CM | POA: Diagnosis not present

## 2017-12-21 DIAGNOSIS — E785 Hyperlipidemia, unspecified: Secondary | ICD-10-CM | POA: Diagnosis not present

## 2017-12-28 DIAGNOSIS — R7301 Impaired fasting glucose: Secondary | ICD-10-CM | POA: Diagnosis not present

## 2017-12-28 DIAGNOSIS — Z0000 Encounter for general adult medical examination without abnormal findings: Secondary | ICD-10-CM | POA: Diagnosis not present

## 2017-12-28 DIAGNOSIS — L409 Psoriasis, unspecified: Secondary | ICD-10-CM | POA: Diagnosis not present

## 2017-12-28 DIAGNOSIS — Z79899 Other long term (current) drug therapy: Secondary | ICD-10-CM | POA: Diagnosis not present

## 2017-12-28 DIAGNOSIS — E785 Hyperlipidemia, unspecified: Secondary | ICD-10-CM | POA: Diagnosis not present

## 2017-12-28 DIAGNOSIS — Z8639 Personal history of other endocrine, nutritional and metabolic disease: Secondary | ICD-10-CM | POA: Diagnosis not present

## 2017-12-29 DIAGNOSIS — Z23 Encounter for immunization: Secondary | ICD-10-CM | POA: Diagnosis not present

## 2018-08-10 DIAGNOSIS — Z23 Encounter for immunization: Secondary | ICD-10-CM | POA: Diagnosis not present

## 2018-09-04 ENCOUNTER — Other Ambulatory Visit: Payer: Self-pay | Admitting: Family Medicine

## 2018-09-04 DIAGNOSIS — Z1231 Encounter for screening mammogram for malignant neoplasm of breast: Secondary | ICD-10-CM

## 2018-11-08 ENCOUNTER — Ambulatory Visit
Admission: RE | Admit: 2018-11-08 | Discharge: 2018-11-08 | Disposition: A | Discharge status: Home / Self Care | Payer: Medicare Other | Source: Ambulatory Visit | Attending: Family Medicine | Admitting: Family Medicine

## 2018-11-08 DIAGNOSIS — Z1231 Encounter for screening mammogram for malignant neoplasm of breast: Secondary | ICD-10-CM | POA: Diagnosis not present

## 2019-01-07 DIAGNOSIS — E0590 Thyrotoxicosis, unspecified without thyrotoxic crisis or storm: Secondary | ICD-10-CM | POA: Diagnosis not present

## 2019-01-07 DIAGNOSIS — Z79899 Other long term (current) drug therapy: Secondary | ICD-10-CM | POA: Diagnosis not present

## 2019-01-07 DIAGNOSIS — Z1159 Encounter for screening for other viral diseases: Secondary | ICD-10-CM | POA: Diagnosis not present

## 2019-01-07 DIAGNOSIS — E785 Hyperlipidemia, unspecified: Secondary | ICD-10-CM | POA: Diagnosis not present

## 2019-01-07 DIAGNOSIS — R7301 Impaired fasting glucose: Secondary | ICD-10-CM | POA: Diagnosis not present

## 2019-01-10 DIAGNOSIS — L409 Psoriasis, unspecified: Secondary | ICD-10-CM | POA: Diagnosis not present

## 2019-01-10 DIAGNOSIS — Z0000 Encounter for general adult medical examination without abnormal findings: Secondary | ICD-10-CM | POA: Diagnosis not present

## 2019-01-10 DIAGNOSIS — Z23 Encounter for immunization: Secondary | ICD-10-CM | POA: Diagnosis not present

## 2019-01-10 DIAGNOSIS — R899 Unspecified abnormal finding in specimens from other organs, systems and tissues: Secondary | ICD-10-CM | POA: Diagnosis not present

## 2019-01-10 DIAGNOSIS — Z8639 Personal history of other endocrine, nutritional and metabolic disease: Secondary | ICD-10-CM | POA: Diagnosis not present

## 2019-01-10 DIAGNOSIS — E785 Hyperlipidemia, unspecified: Secondary | ICD-10-CM | POA: Diagnosis not present

## 2019-01-10 DIAGNOSIS — Z6838 Body mass index (BMI) 38.0-38.9, adult: Secondary | ICD-10-CM | POA: Diagnosis not present

## 2019-01-10 DIAGNOSIS — R7301 Impaired fasting glucose: Secondary | ICD-10-CM | POA: Diagnosis not present

## 2019-01-10 DIAGNOSIS — Z79899 Other long term (current) drug therapy: Secondary | ICD-10-CM | POA: Diagnosis not present

## 2019-01-10 DIAGNOSIS — Z1159 Encounter for screening for other viral diseases: Secondary | ICD-10-CM | POA: Diagnosis not present

## 2019-01-10 DIAGNOSIS — Z1389 Encounter for screening for other disorder: Secondary | ICD-10-CM | POA: Diagnosis not present

## 2019-01-10 DIAGNOSIS — J309 Allergic rhinitis, unspecified: Secondary | ICD-10-CM | POA: Diagnosis not present

## 2019-06-17 DIAGNOSIS — L814 Other melanin hyperpigmentation: Secondary | ICD-10-CM | POA: Diagnosis not present

## 2019-06-17 DIAGNOSIS — C44311 Basal cell carcinoma of skin of nose: Secondary | ICD-10-CM | POA: Diagnosis not present

## 2019-06-17 DIAGNOSIS — L821 Other seborrheic keratosis: Secondary | ICD-10-CM | POA: Diagnosis not present

## 2019-06-17 DIAGNOSIS — Z86018 Personal history of other benign neoplasm: Secondary | ICD-10-CM | POA: Diagnosis not present

## 2019-06-17 DIAGNOSIS — D225 Melanocytic nevi of trunk: Secondary | ICD-10-CM | POA: Diagnosis not present

## 2019-06-17 DIAGNOSIS — Z85828 Personal history of other malignant neoplasm of skin: Secondary | ICD-10-CM | POA: Diagnosis not present

## 2019-06-17 DIAGNOSIS — L820 Inflamed seborrheic keratosis: Secondary | ICD-10-CM | POA: Diagnosis not present

## 2019-06-17 DIAGNOSIS — L400 Psoriasis vulgaris: Secondary | ICD-10-CM | POA: Diagnosis not present

## 2019-06-17 DIAGNOSIS — D485 Neoplasm of uncertain behavior of skin: Secondary | ICD-10-CM | POA: Diagnosis not present

## 2019-07-12 DIAGNOSIS — E785 Hyperlipidemia, unspecified: Secondary | ICD-10-CM | POA: Diagnosis not present

## 2019-07-12 DIAGNOSIS — Z8639 Personal history of other endocrine, nutritional and metabolic disease: Secondary | ICD-10-CM | POA: Diagnosis not present

## 2019-07-12 DIAGNOSIS — R899 Unspecified abnormal finding in specimens from other organs, systems and tissues: Secondary | ICD-10-CM | POA: Diagnosis not present

## 2019-07-12 DIAGNOSIS — Z79899 Other long term (current) drug therapy: Secondary | ICD-10-CM | POA: Diagnosis not present

## 2019-07-12 DIAGNOSIS — L409 Psoriasis, unspecified: Secondary | ICD-10-CM | POA: Diagnosis not present

## 2019-07-12 DIAGNOSIS — J309 Allergic rhinitis, unspecified: Secondary | ICD-10-CM | POA: Diagnosis not present

## 2019-07-12 DIAGNOSIS — R7301 Impaired fasting glucose: Secondary | ICD-10-CM | POA: Diagnosis not present

## 2019-07-15 DIAGNOSIS — R69 Illness, unspecified: Secondary | ICD-10-CM | POA: Diagnosis not present

## 2019-08-19 DIAGNOSIS — C44311 Basal cell carcinoma of skin of nose: Secondary | ICD-10-CM | POA: Diagnosis not present

## 2019-10-04 DIAGNOSIS — J309 Allergic rhinitis, unspecified: Secondary | ICD-10-CM | POA: Diagnosis not present

## 2019-10-04 DIAGNOSIS — R32 Unspecified urinary incontinence: Secondary | ICD-10-CM | POA: Diagnosis not present

## 2019-10-04 DIAGNOSIS — Z87891 Personal history of nicotine dependence: Secondary | ICD-10-CM | POA: Diagnosis not present

## 2019-10-04 DIAGNOSIS — Z833 Family history of diabetes mellitus: Secondary | ICD-10-CM | POA: Diagnosis not present

## 2019-10-04 DIAGNOSIS — Z85828 Personal history of other malignant neoplasm of skin: Secondary | ICD-10-CM | POA: Diagnosis not present

## 2019-10-04 DIAGNOSIS — Z803 Family history of malignant neoplasm of breast: Secondary | ICD-10-CM | POA: Diagnosis not present

## 2019-10-04 DIAGNOSIS — Z825 Family history of asthma and other chronic lower respiratory diseases: Secondary | ICD-10-CM | POA: Diagnosis not present

## 2019-10-04 DIAGNOSIS — Z8249 Family history of ischemic heart disease and other diseases of the circulatory system: Secondary | ICD-10-CM | POA: Diagnosis not present

## 2019-10-04 DIAGNOSIS — Z880 Allergy status to penicillin: Secondary | ICD-10-CM | POA: Diagnosis not present

## 2019-11-17 ENCOUNTER — Ambulatory Visit: Payer: Medicare HMO | Attending: Internal Medicine

## 2019-11-17 ENCOUNTER — Ambulatory Visit: Payer: Self-pay

## 2019-11-17 DIAGNOSIS — Z23 Encounter for immunization: Secondary | ICD-10-CM | POA: Insufficient documentation

## 2019-11-17 NOTE — Progress Notes (Signed)
   Covid-19 Vaccination Clinic  Name:  Dana Munoz    MRN: [id number] DOB: 1951/06/27  11/17/2019  Ms. Main was observed post Covid-19 immunization for 15 minutes without incidence. She was provided with Vaccine Information Sheet and instruction to access the V-Safe system.   [name] was instructed to call 911 with any severe reactions post vaccine: Marland Kitchen Difficulty breathing  . Swelling of your face and throat  . A fast heartbeat  . A bad rash all over your body  . Dizziness and weakness    Immunizations Administered    Name Date Dose VIS Date Route   Pfizer COVID-19 Vaccine 11/17/2019 10:21 AM 0.3 mL 10/04/2019 Intramuscular   Manufacturer: [place]   Lot: [id number]   [place]: [id number]

## 2019-12-02 ENCOUNTER — Ambulatory Visit: Payer: Medicare HMO

## 2019-12-09 ENCOUNTER — Ambulatory Visit: Payer: Medicare HMO | Attending: Internal Medicine

## 2019-12-09 DIAGNOSIS — Z23 Encounter for immunization: Secondary | ICD-10-CM

## 2019-12-09 NOTE — Progress Notes (Signed)
   Covid-19 Vaccination Clinic  Name:  Dana Munoz    MRN: [id number] DOB: 1951/01/03  12/09/2019  Dana Munoz was observed post Covid-19 immunization for 15 minutes without incidence. She was provided with Vaccine Information Sheet and instruction to access the V-Safe system.   Dana Munoz was instructed to call 911 with any severe reactions post vaccine: Marland Kitchen Difficulty breathing  . Swelling of your face and throat  . A fast heartbeat  . A bad rash all over your body  . Dizziness and weakness    Immunizations Administered    Name Date Dose VIS Date Route   Pfizer COVID-19 Vaccine 12/09/2019  8:24 AM 0.3 mL 10/04/2019 Intramuscular   Manufacturer: [place]   Lot: [id number]   [place]: [id number]

## 2019-12-17 DIAGNOSIS — E785 Hyperlipidemia, unspecified: Secondary | ICD-10-CM | POA: Diagnosis not present

## 2019-12-17 DIAGNOSIS — Z79899 Other long term (current) drug therapy: Secondary | ICD-10-CM | POA: Diagnosis not present

## 2019-12-17 DIAGNOSIS — E041 Nontoxic single thyroid nodule: Secondary | ICD-10-CM | POA: Diagnosis not present

## 2019-12-17 DIAGNOSIS — R7301 Impaired fasting glucose: Secondary | ICD-10-CM | POA: Diagnosis not present

## 2020-01-07 DIAGNOSIS — R69 Illness, unspecified: Secondary | ICD-10-CM | POA: Diagnosis not present

## 2020-01-16 DIAGNOSIS — R7301 Impaired fasting glucose: Secondary | ICD-10-CM | POA: Diagnosis not present

## 2020-01-16 DIAGNOSIS — Z8639 Personal history of other endocrine, nutritional and metabolic disease: Secondary | ICD-10-CM | POA: Diagnosis not present

## 2020-01-16 DIAGNOSIS — R8290 Unspecified abnormal findings in urine: Secondary | ICD-10-CM | POA: Diagnosis not present

## 2020-01-16 DIAGNOSIS — Z79899 Other long term (current) drug therapy: Secondary | ICD-10-CM | POA: Diagnosis not present

## 2020-01-16 DIAGNOSIS — L409 Psoriasis, unspecified: Secondary | ICD-10-CM | POA: Diagnosis not present

## 2020-01-16 DIAGNOSIS — Z13820 Encounter for screening for osteoporosis: Secondary | ICD-10-CM | POA: Diagnosis not present

## 2020-01-16 DIAGNOSIS — E785 Hyperlipidemia, unspecified: Secondary | ICD-10-CM | POA: Diagnosis not present

## 2020-01-16 DIAGNOSIS — Z0000 Encounter for general adult medical examination without abnormal findings: Secondary | ICD-10-CM | POA: Diagnosis not present

## 2020-01-21 ENCOUNTER — Other Ambulatory Visit: Payer: Self-pay | Admitting: Family Medicine

## 2020-01-21 DIAGNOSIS — Z1231 Encounter for screening mammogram for malignant neoplasm of breast: Secondary | ICD-10-CM

## 2020-01-21 DIAGNOSIS — E2839 Other primary ovarian failure: Secondary | ICD-10-CM

## 2020-01-28 DIAGNOSIS — K5792 Diverticulitis of intestine, part unspecified, without perforation or abscess without bleeding: Secondary | ICD-10-CM | POA: Diagnosis not present

## 2020-03-31 DIAGNOSIS — H2513 Age-related nuclear cataract, bilateral: Secondary | ICD-10-CM | POA: Diagnosis not present

## 2020-03-31 DIAGNOSIS — H524 Presbyopia: Secondary | ICD-10-CM | POA: Diagnosis not present

## 2020-03-31 DIAGNOSIS — H04123 Dry eye syndrome of bilateral lacrimal glands: Secondary | ICD-10-CM | POA: Diagnosis not present

## 2020-04-03 ENCOUNTER — Ambulatory Visit
Admission: RE | Admit: 2020-04-03 | Discharge: 2020-04-03 | Disposition: A | Discharge status: Home / Self Care | Payer: Medicare HMO | Source: Ambulatory Visit | Attending: Family Medicine | Admitting: Family Medicine

## 2020-04-03 ENCOUNTER — Other Ambulatory Visit: Payer: Self-pay

## 2020-04-03 DIAGNOSIS — E2839 Other primary ovarian failure: Secondary | ICD-10-CM

## 2020-04-03 DIAGNOSIS — Z1231 Encounter for screening mammogram for malignant neoplasm of breast: Secondary | ICD-10-CM | POA: Diagnosis not present

## 2020-04-03 DIAGNOSIS — Z780 Asymptomatic menopausal state: Secondary | ICD-10-CM | POA: Diagnosis not present

## 2020-04-03 DIAGNOSIS — M8589 Other specified disorders of bone density and structure, multiple sites: Secondary | ICD-10-CM | POA: Diagnosis not present

## 2020-06-16 DIAGNOSIS — L814 Other melanin hyperpigmentation: Secondary | ICD-10-CM | POA: Diagnosis not present

## 2020-06-16 DIAGNOSIS — Z85828 Personal history of other malignant neoplasm of skin: Secondary | ICD-10-CM | POA: Diagnosis not present

## 2020-06-16 DIAGNOSIS — Z86018 Personal history of other benign neoplasm: Secondary | ICD-10-CM | POA: Diagnosis not present

## 2020-06-16 DIAGNOSIS — D225 Melanocytic nevi of trunk: Secondary | ICD-10-CM | POA: Diagnosis not present

## 2020-06-16 DIAGNOSIS — L821 Other seborrheic keratosis: Secondary | ICD-10-CM | POA: Diagnosis not present

## 2020-06-16 DIAGNOSIS — L400 Psoriasis vulgaris: Secondary | ICD-10-CM | POA: Diagnosis not present

## 2020-06-16 DIAGNOSIS — Z23 Encounter for immunization: Secondary | ICD-10-CM | POA: Diagnosis not present

## 2020-06-16 DIAGNOSIS — J3489 Other specified disorders of nose and nasal sinuses: Secondary | ICD-10-CM | POA: Diagnosis not present

## 2020-06-18 DIAGNOSIS — R69 Illness, unspecified: Secondary | ICD-10-CM | POA: Diagnosis not present

## 2020-06-30 DIAGNOSIS — Z23 Encounter for immunization: Secondary | ICD-10-CM | POA: Diagnosis not present

## 2021-04-02 DIAGNOSIS — H40013 Open angle with borderline findings, low risk, bilateral: Secondary | ICD-10-CM | POA: Diagnosis not present

## 2021-04-02 DIAGNOSIS — H2513 Age-related nuclear cataract, bilateral: Secondary | ICD-10-CM | POA: Diagnosis not present

## 2021-04-02 DIAGNOSIS — H524 Presbyopia: Secondary | ICD-10-CM | POA: Diagnosis not present

## 2021-04-02 DIAGNOSIS — H5203 Hypermetropia, bilateral: Secondary | ICD-10-CM | POA: Diagnosis not present

## 2021-05-27 DIAGNOSIS — H40013 Open angle with borderline findings, low risk, bilateral: Secondary | ICD-10-CM | POA: Diagnosis not present

## 2021-06-16 DIAGNOSIS — E0590 Thyrotoxicosis, unspecified without thyrotoxic crisis or storm: Secondary | ICD-10-CM | POA: Diagnosis not present

## 2021-06-16 DIAGNOSIS — R7301 Impaired fasting glucose: Secondary | ICD-10-CM | POA: Diagnosis not present

## 2021-06-16 DIAGNOSIS — R3915 Urgency of urination: Secondary | ICD-10-CM | POA: Diagnosis not present

## 2021-06-16 DIAGNOSIS — L409 Psoriasis, unspecified: Secondary | ICD-10-CM | POA: Diagnosis not present

## 2021-06-16 DIAGNOSIS — E785 Hyperlipidemia, unspecified: Secondary | ICD-10-CM | POA: Diagnosis not present

## 2021-06-23 DIAGNOSIS — L821 Other seborrheic keratosis: Secondary | ICD-10-CM | POA: Diagnosis not present

## 2021-06-23 DIAGNOSIS — L814 Other melanin hyperpigmentation: Secondary | ICD-10-CM | POA: Diagnosis not present

## 2021-06-23 DIAGNOSIS — L400 Psoriasis vulgaris: Secondary | ICD-10-CM | POA: Diagnosis not present

## 2021-06-23 DIAGNOSIS — Z85828 Personal history of other malignant neoplasm of skin: Secondary | ICD-10-CM | POA: Diagnosis not present

## 2021-06-23 DIAGNOSIS — L578 Other skin changes due to chronic exposure to nonionizing radiation: Secondary | ICD-10-CM | POA: Diagnosis not present

## 2021-06-23 DIAGNOSIS — D225 Melanocytic nevi of trunk: Secondary | ICD-10-CM | POA: Diagnosis not present

## 2021-06-23 DIAGNOSIS — Z86018 Personal history of other benign neoplasm: Secondary | ICD-10-CM | POA: Diagnosis not present

## 2021-06-25 DIAGNOSIS — L400 Psoriasis vulgaris: Secondary | ICD-10-CM | POA: Diagnosis not present

## 2021-06-30 DIAGNOSIS — L400 Psoriasis vulgaris: Secondary | ICD-10-CM | POA: Diagnosis not present

## 2021-07-02 DIAGNOSIS — L400 Psoriasis vulgaris: Secondary | ICD-10-CM | POA: Diagnosis not present

## 2021-07-05 DIAGNOSIS — L400 Psoriasis vulgaris: Secondary | ICD-10-CM | POA: Diagnosis not present

## 2021-07-07 DIAGNOSIS — L400 Psoriasis vulgaris: Secondary | ICD-10-CM | POA: Diagnosis not present

## 2021-07-09 DIAGNOSIS — L400 Psoriasis vulgaris: Secondary | ICD-10-CM | POA: Diagnosis not present

## 2021-07-12 DIAGNOSIS — L400 Psoriasis vulgaris: Secondary | ICD-10-CM | POA: Diagnosis not present

## 2021-07-13 DIAGNOSIS — Z23 Encounter for immunization: Secondary | ICD-10-CM | POA: Diagnosis not present

## 2021-07-14 DIAGNOSIS — L400 Psoriasis vulgaris: Secondary | ICD-10-CM | POA: Diagnosis not present

## 2021-07-16 DIAGNOSIS — L400 Psoriasis vulgaris: Secondary | ICD-10-CM | POA: Diagnosis not present

## 2021-07-19 DIAGNOSIS — L400 Psoriasis vulgaris: Secondary | ICD-10-CM | POA: Diagnosis not present

## 2021-07-21 DIAGNOSIS — L400 Psoriasis vulgaris: Secondary | ICD-10-CM | POA: Diagnosis not present

## 2021-07-23 DIAGNOSIS — L400 Psoriasis vulgaris: Secondary | ICD-10-CM | POA: Diagnosis not present

## 2021-07-26 DIAGNOSIS — L400 Psoriasis vulgaris: Secondary | ICD-10-CM | POA: Diagnosis not present

## 2021-07-28 DIAGNOSIS — L400 Psoriasis vulgaris: Secondary | ICD-10-CM | POA: Diagnosis not present

## 2021-07-30 DIAGNOSIS — L400 Psoriasis vulgaris: Secondary | ICD-10-CM | POA: Diagnosis not present

## 2021-08-02 DIAGNOSIS — L400 Psoriasis vulgaris: Secondary | ICD-10-CM | POA: Diagnosis not present

## 2021-08-04 DIAGNOSIS — L400 Psoriasis vulgaris: Secondary | ICD-10-CM | POA: Diagnosis not present

## 2021-08-06 DIAGNOSIS — L400 Psoriasis vulgaris: Secondary | ICD-10-CM | POA: Diagnosis not present

## 2021-08-23 DIAGNOSIS — L400 Psoriasis vulgaris: Secondary | ICD-10-CM | POA: Diagnosis not present

## 2021-08-23 DIAGNOSIS — Z86018 Personal history of other benign neoplasm: Secondary | ICD-10-CM | POA: Diagnosis not present

## 2021-08-23 DIAGNOSIS — Z85828 Personal history of other malignant neoplasm of skin: Secondary | ICD-10-CM | POA: Diagnosis not present

## 2021-08-25 DIAGNOSIS — Z86018 Personal history of other benign neoplasm: Secondary | ICD-10-CM | POA: Diagnosis not present

## 2021-08-25 DIAGNOSIS — L400 Psoriasis vulgaris: Secondary | ICD-10-CM | POA: Diagnosis not present

## 2021-08-25 DIAGNOSIS — Z85828 Personal history of other malignant neoplasm of skin: Secondary | ICD-10-CM | POA: Diagnosis not present

## 2021-08-27 DIAGNOSIS — L400 Psoriasis vulgaris: Secondary | ICD-10-CM | POA: Diagnosis not present

## 2021-08-30 DIAGNOSIS — L400 Psoriasis vulgaris: Secondary | ICD-10-CM | POA: Diagnosis not present

## 2021-09-01 DIAGNOSIS — L400 Psoriasis vulgaris: Secondary | ICD-10-CM | POA: Diagnosis not present

## 2021-09-03 DIAGNOSIS — L400 Psoriasis vulgaris: Secondary | ICD-10-CM | POA: Diagnosis not present

## 2021-09-06 DIAGNOSIS — L400 Psoriasis vulgaris: Secondary | ICD-10-CM | POA: Diagnosis not present

## 2021-09-08 DIAGNOSIS — L400 Psoriasis vulgaris: Secondary | ICD-10-CM | POA: Diagnosis not present

## 2021-09-10 DIAGNOSIS — L400 Psoriasis vulgaris: Secondary | ICD-10-CM | POA: Diagnosis not present

## 2021-09-13 DIAGNOSIS — E0500 Thyrotoxicosis with diffuse goiter without thyrotoxic crisis or storm: Secondary | ICD-10-CM | POA: Diagnosis not present

## 2021-09-13 DIAGNOSIS — L400 Psoriasis vulgaris: Secondary | ICD-10-CM | POA: Diagnosis not present

## 2021-09-15 DIAGNOSIS — L400 Psoriasis vulgaris: Secondary | ICD-10-CM | POA: Diagnosis not present

## 2021-09-20 DIAGNOSIS — L400 Psoriasis vulgaris: Secondary | ICD-10-CM | POA: Diagnosis not present

## 2021-09-22 DIAGNOSIS — L400 Psoriasis vulgaris: Secondary | ICD-10-CM | POA: Diagnosis not present

## 2021-09-24 DIAGNOSIS — L400 Psoriasis vulgaris: Secondary | ICD-10-CM | POA: Diagnosis not present

## 2021-09-29 DIAGNOSIS — L400 Psoriasis vulgaris: Secondary | ICD-10-CM | POA: Diagnosis not present

## 2021-10-01 DIAGNOSIS — L400 Psoriasis vulgaris: Secondary | ICD-10-CM | POA: Diagnosis not present

## 2021-10-06 DIAGNOSIS — L400 Psoriasis vulgaris: Secondary | ICD-10-CM | POA: Diagnosis not present

## 2021-10-08 DIAGNOSIS — L400 Psoriasis vulgaris: Secondary | ICD-10-CM | POA: Diagnosis not present

## 2021-10-11 DIAGNOSIS — L400 Psoriasis vulgaris: Secondary | ICD-10-CM | POA: Diagnosis not present

## 2021-10-19 DIAGNOSIS — L400 Psoriasis vulgaris: Secondary | ICD-10-CM | POA: Diagnosis not present

## 2021-10-21 DIAGNOSIS — L400 Psoriasis vulgaris: Secondary | ICD-10-CM | POA: Diagnosis not present

## 2021-10-26 DIAGNOSIS — L400 Psoriasis vulgaris: Secondary | ICD-10-CM | POA: Diagnosis not present

## 2021-10-28 DIAGNOSIS — L400 Psoriasis vulgaris: Secondary | ICD-10-CM | POA: Diagnosis not present

## 2021-11-01 DIAGNOSIS — L400 Psoriasis vulgaris: Secondary | ICD-10-CM | POA: Diagnosis not present

## 2021-11-03 DIAGNOSIS — L400 Psoriasis vulgaris: Secondary | ICD-10-CM | POA: Diagnosis not present

## 2021-11-05 DIAGNOSIS — L400 Psoriasis vulgaris: Secondary | ICD-10-CM | POA: Diagnosis not present

## 2021-11-08 DIAGNOSIS — L400 Psoriasis vulgaris: Secondary | ICD-10-CM | POA: Diagnosis not present

## 2021-11-10 DIAGNOSIS — L400 Psoriasis vulgaris: Secondary | ICD-10-CM | POA: Diagnosis not present

## 2021-11-12 DIAGNOSIS — L400 Psoriasis vulgaris: Secondary | ICD-10-CM | POA: Diagnosis not present

## 2021-11-15 DIAGNOSIS — L400 Psoriasis vulgaris: Secondary | ICD-10-CM | POA: Diagnosis not present

## 2021-11-17 DIAGNOSIS — L400 Psoriasis vulgaris: Secondary | ICD-10-CM | POA: Diagnosis not present

## 2021-11-19 DIAGNOSIS — L400 Psoriasis vulgaris: Secondary | ICD-10-CM | POA: Diagnosis not present

## 2021-11-22 DIAGNOSIS — L400 Psoriasis vulgaris: Secondary | ICD-10-CM | POA: Diagnosis not present

## 2021-11-24 DIAGNOSIS — L400 Psoriasis vulgaris: Secondary | ICD-10-CM | POA: Diagnosis not present

## 2021-11-26 DIAGNOSIS — L400 Psoriasis vulgaris: Secondary | ICD-10-CM | POA: Diagnosis not present

## 2021-12-15 DIAGNOSIS — H10502 Unspecified blepharoconjunctivitis, left eye: Secondary | ICD-10-CM | POA: Diagnosis not present

## 2021-12-15 DIAGNOSIS — H00015 Hordeolum externum left lower eyelid: Secondary | ICD-10-CM | POA: Diagnosis not present

## 2021-12-20 ENCOUNTER — Other Ambulatory Visit: Payer: Self-pay | Admitting: Family Medicine

## 2021-12-20 DIAGNOSIS — Z1231 Encounter for screening mammogram for malignant neoplasm of breast: Secondary | ICD-10-CM

## 2022-01-10 DIAGNOSIS — E559 Vitamin D deficiency, unspecified: Secondary | ICD-10-CM | POA: Diagnosis not present

## 2022-01-10 DIAGNOSIS — E785 Hyperlipidemia, unspecified: Secondary | ICD-10-CM | POA: Diagnosis not present

## 2022-01-10 DIAGNOSIS — R7301 Impaired fasting glucose: Secondary | ICD-10-CM | POA: Diagnosis not present

## 2022-01-10 DIAGNOSIS — E0500 Thyrotoxicosis with diffuse goiter without thyrotoxic crisis or storm: Secondary | ICD-10-CM | POA: Diagnosis not present

## 2022-01-13 DIAGNOSIS — Z1211 Encounter for screening for malignant neoplasm of colon: Secondary | ICD-10-CM | POA: Diagnosis not present

## 2022-01-13 DIAGNOSIS — E785 Hyperlipidemia, unspecified: Secondary | ICD-10-CM | POA: Diagnosis not present

## 2022-01-13 DIAGNOSIS — R7301 Impaired fasting glucose: Secondary | ICD-10-CM | POA: Diagnosis not present

## 2022-01-13 DIAGNOSIS — R2241 Localized swelling, mass and lump, right lower limb: Secondary | ICD-10-CM | POA: Diagnosis not present

## 2022-01-13 DIAGNOSIS — D72819 Decreased white blood cell count, unspecified: Secondary | ICD-10-CM | POA: Diagnosis not present

## 2022-01-13 DIAGNOSIS — E0500 Thyrotoxicosis with diffuse goiter without thyrotoxic crisis or storm: Secondary | ICD-10-CM | POA: Diagnosis not present

## 2022-01-13 DIAGNOSIS — Z0000 Encounter for general adult medical examination without abnormal findings: Secondary | ICD-10-CM | POA: Diagnosis not present

## 2022-01-25 ENCOUNTER — Ambulatory Visit
Admission: RE | Admit: 2022-01-25 | Discharge: 2022-01-25 | Disposition: A | Discharge status: Home / Self Care | Payer: Medicare HMO | Source: Ambulatory Visit | Attending: Family Medicine | Admitting: Family Medicine

## 2022-01-25 ENCOUNTER — Other Ambulatory Visit: Payer: Self-pay | Admitting: Family Medicine

## 2022-01-25 DIAGNOSIS — R2241 Localized swelling, mass and lump, right lower limb: Secondary | ICD-10-CM

## 2022-04-04 ENCOUNTER — Ambulatory Visit
Admission: RE | Admit: 2022-04-04 | Discharge: 2022-04-04 | Disposition: A | Discharge status: Home / Self Care | Payer: Medicare HMO | Source: Ambulatory Visit | Attending: Family Medicine | Admitting: Family Medicine

## 2022-04-04 DIAGNOSIS — Z1231 Encounter for screening mammogram for malignant neoplasm of breast: Secondary | ICD-10-CM

## 2022-06-28 DIAGNOSIS — Z85828 Personal history of other malignant neoplasm of skin: Secondary | ICD-10-CM | POA: Diagnosis not present

## 2022-06-28 DIAGNOSIS — D225 Melanocytic nevi of trunk: Secondary | ICD-10-CM | POA: Diagnosis not present

## 2022-06-28 DIAGNOSIS — D485 Neoplasm of uncertain behavior of skin: Secondary | ICD-10-CM | POA: Diagnosis not present

## 2022-06-28 DIAGNOSIS — C4401 Basal cell carcinoma of skin of lip: Secondary | ICD-10-CM | POA: Diagnosis not present

## 2022-06-28 DIAGNOSIS — L400 Psoriasis vulgaris: Secondary | ICD-10-CM | POA: Diagnosis not present

## 2022-06-28 DIAGNOSIS — Z86018 Personal history of other benign neoplasm: Secondary | ICD-10-CM | POA: Diagnosis not present

## 2022-06-28 DIAGNOSIS — L570 Actinic keratosis: Secondary | ICD-10-CM | POA: Diagnosis not present

## 2022-06-28 DIAGNOSIS — L821 Other seborrheic keratosis: Secondary | ICD-10-CM | POA: Diagnosis not present

## 2022-06-28 DIAGNOSIS — L578 Other skin changes due to chronic exposure to nonionizing radiation: Secondary | ICD-10-CM | POA: Diagnosis not present

## 2022-06-28 DIAGNOSIS — L814 Other melanin hyperpigmentation: Secondary | ICD-10-CM | POA: Diagnosis not present

## 2022-07-14 DIAGNOSIS — C4401 Basal cell carcinoma of skin of lip: Secondary | ICD-10-CM | POA: Diagnosis not present

## 2022-09-06 DIAGNOSIS — H40023 Open angle with borderline findings, high risk, bilateral: Secondary | ICD-10-CM | POA: Diagnosis not present

## 2022-09-06 DIAGNOSIS — H2513 Age-related nuclear cataract, bilateral: Secondary | ICD-10-CM | POA: Diagnosis not present

## 2022-10-27 DIAGNOSIS — Z5189 Encounter for other specified aftercare: Secondary | ICD-10-CM | POA: Diagnosis not present

## 2022-10-27 DIAGNOSIS — Z85828 Personal history of other malignant neoplasm of skin: Secondary | ICD-10-CM | POA: Diagnosis not present

## 2023-02-20 DIAGNOSIS — E559 Vitamin D deficiency, unspecified: Secondary | ICD-10-CM | POA: Diagnosis not present

## 2023-02-20 DIAGNOSIS — E0500 Thyrotoxicosis with diffuse goiter without thyrotoxic crisis or storm: Secondary | ICD-10-CM | POA: Diagnosis not present

## 2023-02-20 DIAGNOSIS — E785 Hyperlipidemia, unspecified: Secondary | ICD-10-CM | POA: Diagnosis not present

## 2023-02-20 DIAGNOSIS — R7301 Impaired fasting glucose: Secondary | ICD-10-CM | POA: Diagnosis not present

## 2023-02-20 DIAGNOSIS — Z1211 Encounter for screening for malignant neoplasm of colon: Secondary | ICD-10-CM | POA: Diagnosis not present

## 2023-02-22 DIAGNOSIS — E785 Hyperlipidemia, unspecified: Secondary | ICD-10-CM | POA: Diagnosis not present

## 2023-02-22 DIAGNOSIS — M549 Dorsalgia, unspecified: Secondary | ICD-10-CM | POA: Diagnosis not present

## 2023-02-22 DIAGNOSIS — R7309 Other abnormal glucose: Secondary | ICD-10-CM | POA: Diagnosis not present

## 2023-02-22 DIAGNOSIS — E559 Vitamin D deficiency, unspecified: Secondary | ICD-10-CM | POA: Diagnosis not present

## 2023-02-22 DIAGNOSIS — D72819 Decreased white blood cell count, unspecified: Secondary | ICD-10-CM | POA: Diagnosis not present

## 2023-02-22 DIAGNOSIS — E2839 Other primary ovarian failure: Secondary | ICD-10-CM | POA: Diagnosis not present

## 2023-02-22 DIAGNOSIS — Z1211 Encounter for screening for malignant neoplasm of colon: Secondary | ICD-10-CM | POA: Diagnosis not present

## 2023-02-22 DIAGNOSIS — R195 Other fecal abnormalities: Secondary | ICD-10-CM | POA: Diagnosis not present

## 2023-02-22 DIAGNOSIS — E0500 Thyrotoxicosis with diffuse goiter without thyrotoxic crisis or storm: Secondary | ICD-10-CM | POA: Diagnosis not present

## 2023-02-22 DIAGNOSIS — Z0000 Encounter for general adult medical examination without abnormal findings: Secondary | ICD-10-CM | POA: Diagnosis not present

## 2023-02-22 DIAGNOSIS — N3946 Mixed incontinence: Secondary | ICD-10-CM | POA: Diagnosis not present

## 2023-02-23 ENCOUNTER — Other Ambulatory Visit: Payer: Self-pay | Admitting: Family Medicine

## 2023-02-23 DIAGNOSIS — Z1231 Encounter for screening mammogram for malignant neoplasm of breast: Secondary | ICD-10-CM

## 2023-02-23 DIAGNOSIS — E2839 Other primary ovarian failure: Secondary | ICD-10-CM

## 2023-03-07 DIAGNOSIS — M549 Dorsalgia, unspecified: Secondary | ICD-10-CM | POA: Diagnosis not present

## 2023-07-08 DIAGNOSIS — X58XXXA Exposure to other specified factors, initial encounter: Secondary | ICD-10-CM | POA: Diagnosis not present

## 2023-07-08 DIAGNOSIS — S8011XA Contusion of right lower leg, initial encounter: Secondary | ICD-10-CM | POA: Diagnosis not present

## 2023-07-08 DIAGNOSIS — S8012XA Contusion of left lower leg, initial encounter: Secondary | ICD-10-CM | POA: Diagnosis not present

## 2023-07-10 DIAGNOSIS — L821 Other seborrheic keratosis: Secondary | ICD-10-CM | POA: Diagnosis not present

## 2023-07-10 DIAGNOSIS — Z85828 Personal history of other malignant neoplasm of skin: Secondary | ICD-10-CM | POA: Diagnosis not present

## 2023-07-10 DIAGNOSIS — L814 Other melanin hyperpigmentation: Secondary | ICD-10-CM | POA: Diagnosis not present

## 2023-07-10 DIAGNOSIS — L400 Psoriasis vulgaris: Secondary | ICD-10-CM | POA: Diagnosis not present

## 2023-07-10 DIAGNOSIS — C44319 Basal cell carcinoma of skin of other parts of face: Secondary | ICD-10-CM | POA: Diagnosis not present

## 2023-07-10 DIAGNOSIS — L578 Other skin changes due to chronic exposure to nonionizing radiation: Secondary | ICD-10-CM | POA: Diagnosis not present

## 2023-07-10 DIAGNOSIS — D225 Melanocytic nevi of trunk: Secondary | ICD-10-CM | POA: Diagnosis not present

## 2023-07-10 DIAGNOSIS — C44712 Basal cell carcinoma of skin of right lower limb, including hip: Secondary | ICD-10-CM | POA: Diagnosis not present

## 2023-07-10 DIAGNOSIS — D485 Neoplasm of uncertain behavior of skin: Secondary | ICD-10-CM | POA: Diagnosis not present

## 2023-08-15 DIAGNOSIS — C44319 Basal cell carcinoma of skin of other parts of face: Secondary | ICD-10-CM | POA: Diagnosis not present

## 2023-08-29 DIAGNOSIS — C44712 Basal cell carcinoma of skin of right lower limb, including hip: Secondary | ICD-10-CM | POA: Diagnosis not present

## 2023-09-12 DIAGNOSIS — H40013 Open angle with borderline findings, low risk, bilateral: Secondary | ICD-10-CM | POA: Diagnosis not present

## 2023-09-12 DIAGNOSIS — H5203 Hypermetropia, bilateral: Secondary | ICD-10-CM | POA: Diagnosis not present

## 2023-09-14 DIAGNOSIS — C44712 Basal cell carcinoma of skin of right lower limb, including hip: Secondary | ICD-10-CM | POA: Diagnosis not present

## 2023-09-14 DIAGNOSIS — D2372 Other benign neoplasm of skin of left lower limb, including hip: Secondary | ICD-10-CM | POA: Diagnosis not present

## 2023-09-14 DIAGNOSIS — Z4802 Encounter for removal of sutures: Secondary | ICD-10-CM | POA: Diagnosis not present

## 2023-09-14 DIAGNOSIS — T8131XA Disruption of external operation (surgical) wound, not elsewhere classified, initial encounter: Secondary | ICD-10-CM | POA: Diagnosis not present

## 2023-09-14 DIAGNOSIS — D485 Neoplasm of uncertain behavior of skin: Secondary | ICD-10-CM | POA: Diagnosis not present

## 2023-09-25 ENCOUNTER — Ambulatory Visit
Admission: RE | Admit: 2023-09-25 | Discharge: 2023-09-25 | Disposition: A | Discharge status: Home / Self Care | Payer: Medicare HMO | Source: Ambulatory Visit | Attending: Family Medicine | Admitting: Family Medicine

## 2023-09-25 DIAGNOSIS — E2839 Other primary ovarian failure: Secondary | ICD-10-CM | POA: Diagnosis not present

## 2023-09-25 DIAGNOSIS — M8588 Other specified disorders of bone density and structure, other site: Secondary | ICD-10-CM | POA: Diagnosis not present

## 2023-09-25 DIAGNOSIS — N958 Other specified menopausal and perimenopausal disorders: Secondary | ICD-10-CM | POA: Diagnosis not present

## 2023-09-25 DIAGNOSIS — Z1231 Encounter for screening mammogram for malignant neoplasm of breast: Secondary | ICD-10-CM

## 2023-09-26 DIAGNOSIS — C44712 Basal cell carcinoma of skin of right lower limb, including hip: Secondary | ICD-10-CM | POA: Diagnosis not present

## 2023-10-10 DIAGNOSIS — Z5189 Encounter for other specified aftercare: Secondary | ICD-10-CM | POA: Diagnosis not present

## 2023-10-10 DIAGNOSIS — D239 Other benign neoplasm of skin, unspecified: Secondary | ICD-10-CM | POA: Diagnosis not present

## 2023-11-01 DIAGNOSIS — C44712 Basal cell carcinoma of skin of right lower limb, including hip: Secondary | ICD-10-CM | POA: Diagnosis not present

## 2023-11-13 DIAGNOSIS — Z5189 Encounter for other specified aftercare: Secondary | ICD-10-CM | POA: Diagnosis not present

## 2024-01-16 DIAGNOSIS — L501 Idiopathic urticaria: Secondary | ICD-10-CM | POA: Diagnosis not present

## 2024-01-16 DIAGNOSIS — R251 Tremor, unspecified: Secondary | ICD-10-CM | POA: Diagnosis not present

## 2024-01-31 DIAGNOSIS — L501 Idiopathic urticaria: Secondary | ICD-10-CM | POA: Diagnosis not present

## 2024-01-31 DIAGNOSIS — J3089 Other allergic rhinitis: Secondary | ICD-10-CM | POA: Diagnosis not present

## 2024-03-11 DIAGNOSIS — Z0000 Encounter for general adult medical examination without abnormal findings: Secondary | ICD-10-CM | POA: Diagnosis not present

## 2024-03-11 DIAGNOSIS — Z7189 Other specified counseling: Secondary | ICD-10-CM | POA: Diagnosis not present

## 2024-03-11 DIAGNOSIS — N3946 Mixed incontinence: Secondary | ICD-10-CM | POA: Diagnosis not present

## 2024-03-11 DIAGNOSIS — E0500 Thyrotoxicosis with diffuse goiter without thyrotoxic crisis or storm: Secondary | ICD-10-CM | POA: Diagnosis not present

## 2024-03-11 DIAGNOSIS — E559 Vitamin D deficiency, unspecified: Secondary | ICD-10-CM | POA: Diagnosis not present

## 2024-03-11 DIAGNOSIS — D72819 Decreased white blood cell count, unspecified: Secondary | ICD-10-CM | POA: Diagnosis not present

## 2024-03-11 DIAGNOSIS — Z1211 Encounter for screening for malignant neoplasm of colon: Secondary | ICD-10-CM | POA: Diagnosis not present

## 2024-03-11 DIAGNOSIS — L409 Psoriasis, unspecified: Secondary | ICD-10-CM | POA: Diagnosis not present

## 2024-03-11 DIAGNOSIS — E785 Hyperlipidemia, unspecified: Secondary | ICD-10-CM | POA: Diagnosis not present

## 2024-03-11 DIAGNOSIS — R7301 Impaired fasting glucose: Secondary | ICD-10-CM | POA: Diagnosis not present

## 2024-03-16 IMAGING — US US PELVIS LIMITED
1 series · 14 of 21 positions shown · non-contrast
Comparison: None.

CLINICAL DATA: Right hip mass

EXAM:
US PELVIS LIMITED
TECHNIQUE: Ultrasound examination of the pelvic soft tissues was performed in
the area of clinical concern.

[Series 1: us pelvis limited · 0.14mm/px · 21 acquisitions, 14 frames shown]
[im 1/21]
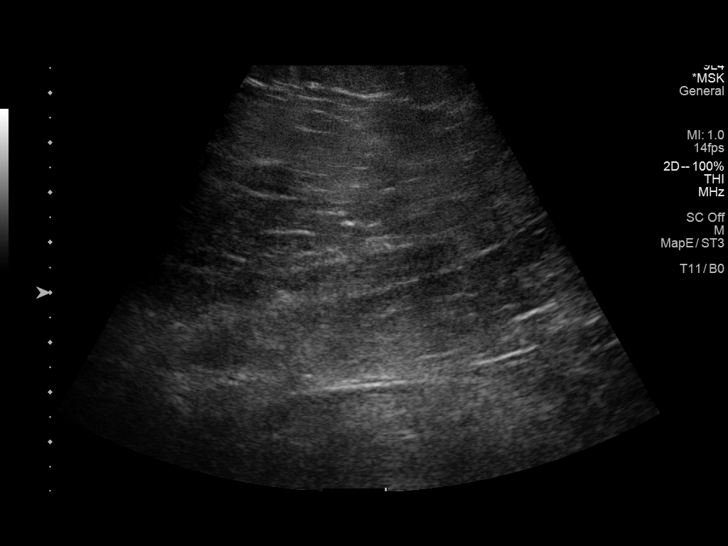
[im 3/21]
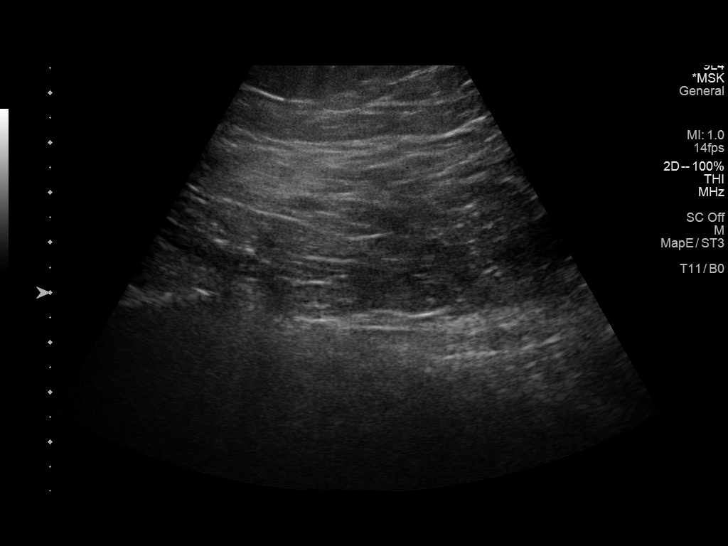
[im 4/21]
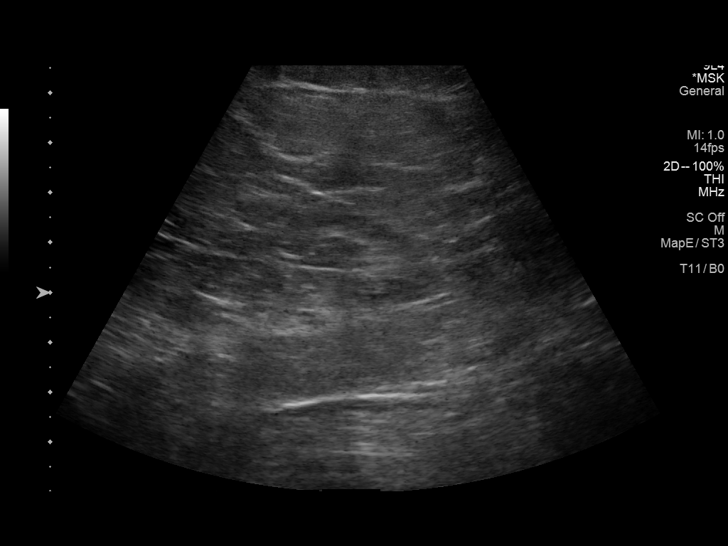
[im 6/21]
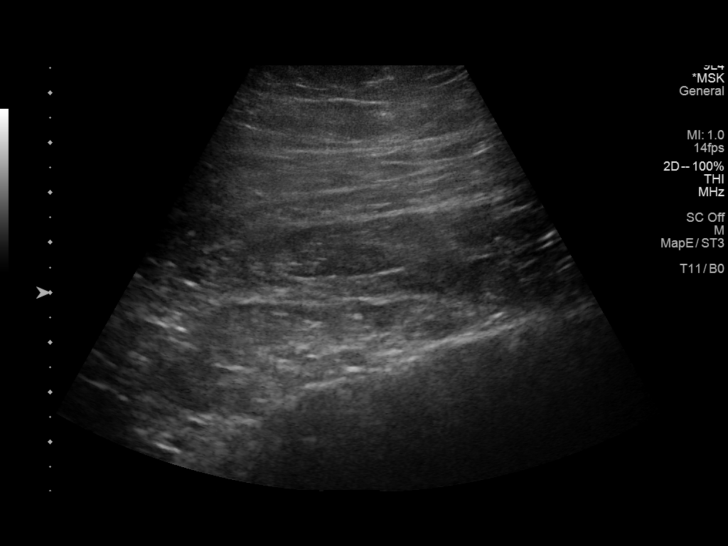
[im 7/21]
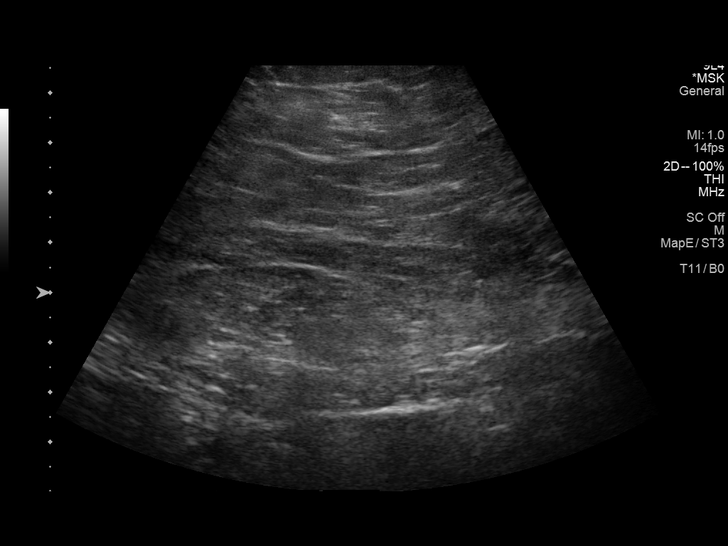
[im 9/21]
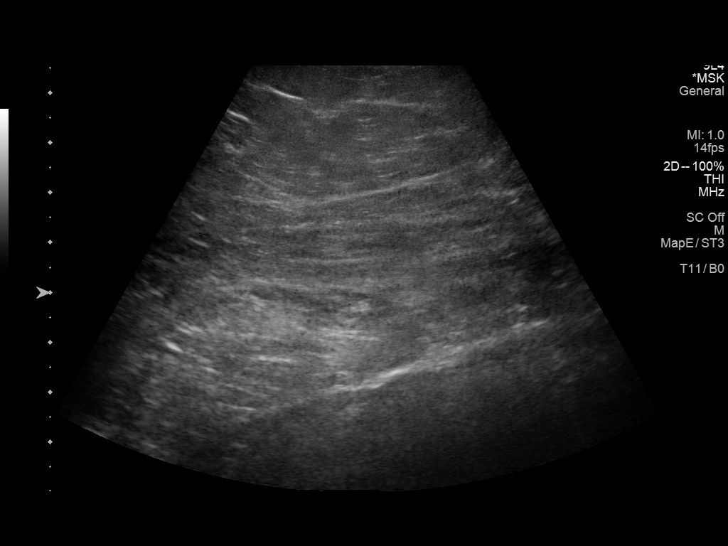
[im 10/21]
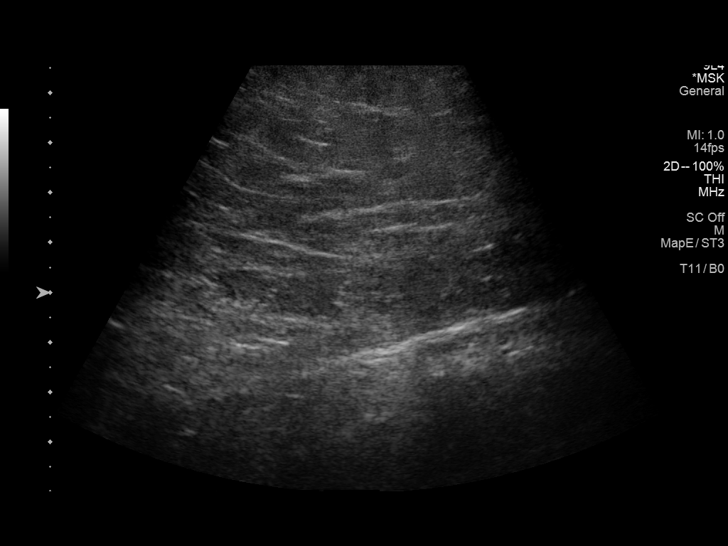
[im 12/21]
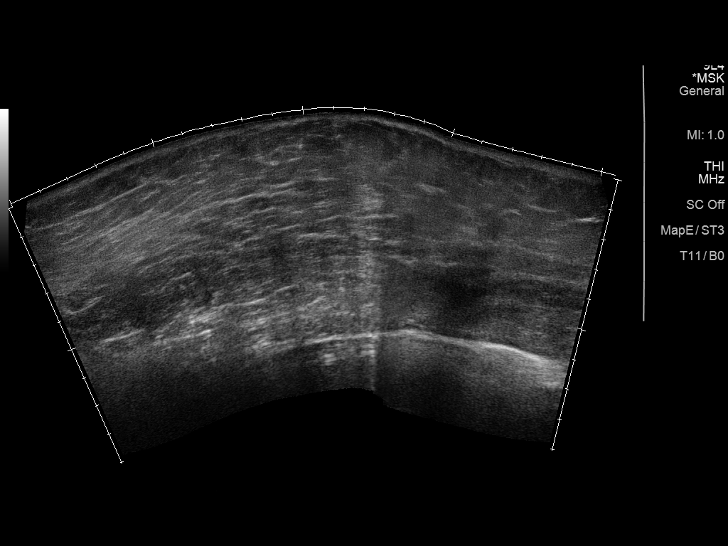
[im 13/21]
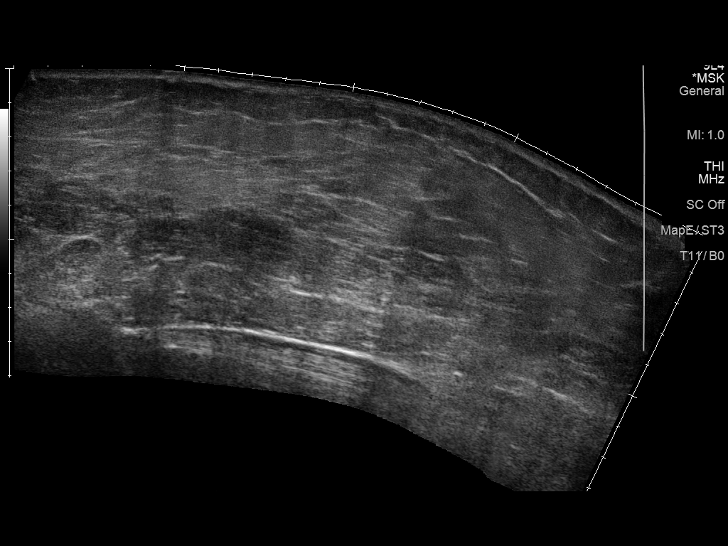
[im 15/21]
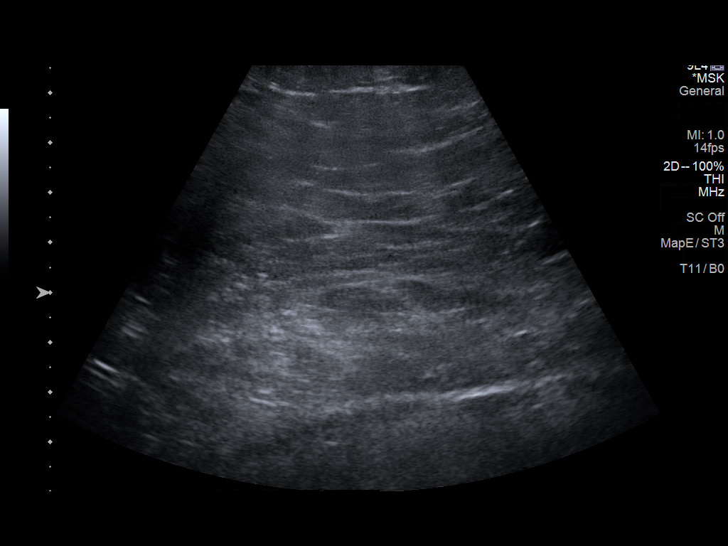
[im 16/21]
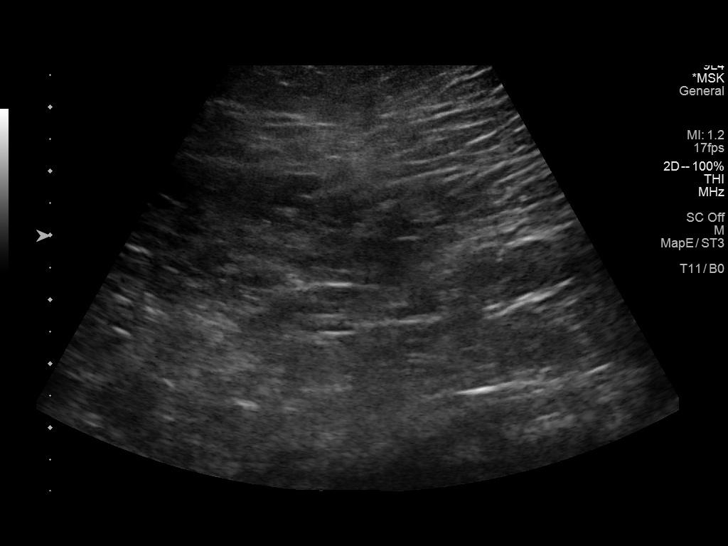
[im 18/21]
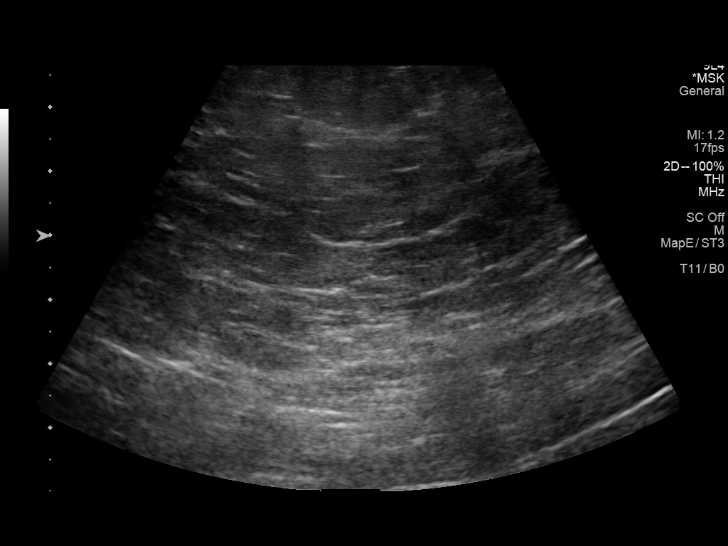
[im 19/21]
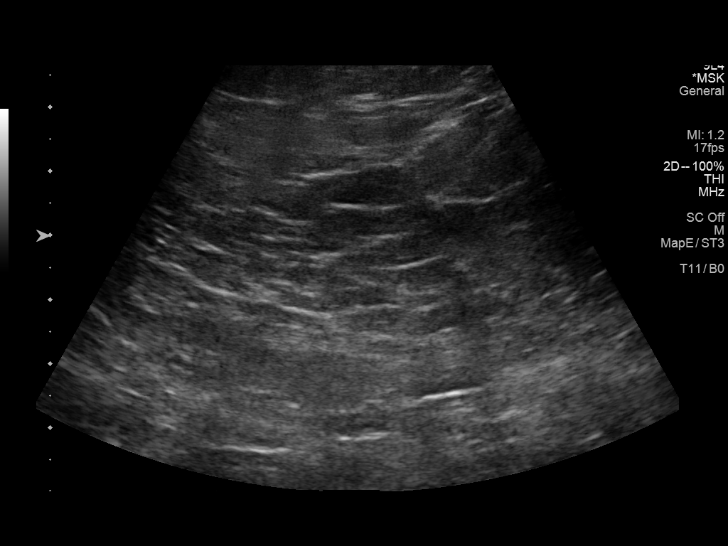
[im 21/21]
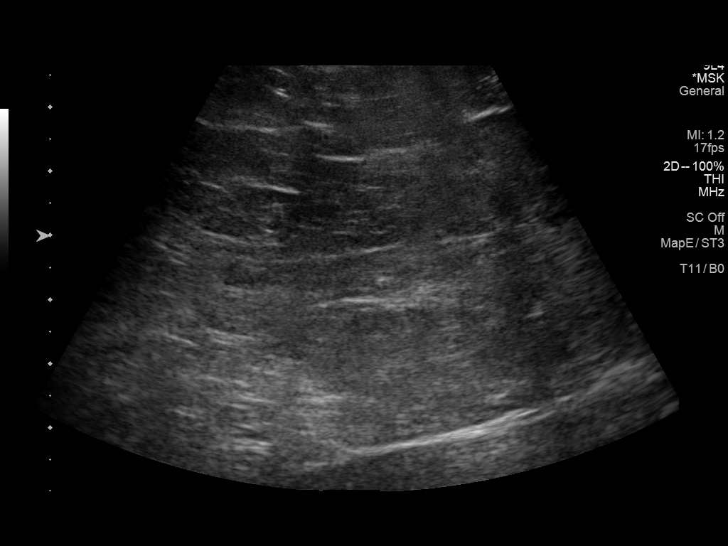

[14 of 21 positions shown; findings below may reference images not displayed]

FINDINGS: Limited grayscale and color Doppler sonography of the right hip in
the area of reported palpable abnormality demonstrates no abnormal
subcutaneous soft tissue mass, fluid collection, or calcification.
No pathologic adenopathy.
IMPRESSION: Unremarkable examination. No sonographic correlate for the patient's
reported palpable abnormality.

## 2024-05-14 DIAGNOSIS — J3089 Other allergic rhinitis: Secondary | ICD-10-CM | POA: Insufficient documentation

## 2024-05-16 ENCOUNTER — Ambulatory Visit: Payer: Self-pay | Admitting: Neurology

## 2024-05-16 ENCOUNTER — Encounter: Payer: Self-pay | Admitting: Neurology

## 2024-05-16 ENCOUNTER — Telehealth: Payer: Self-pay | Admitting: Neurology

## 2024-05-16 VITALS — BP 138/72 | Ht 68.0 in | Wt 246.0 lb

## 2024-05-16 DIAGNOSIS — G20A1 Parkinson's disease without dyskinesia, without mention of fluctuations: Secondary | ICD-10-CM

## 2024-05-16 MED ORDER — ALPRAZOLAM 0.5 MG PO TABS: ORAL_TABLET | ORAL | 0 refills | Status: AC

## 2024-05-16 MED ORDER — RASAGILINE MESYLATE 0.5 MG PO TABS: 0.5000 mg | ORAL_TABLET | Freq: Every day | ORAL | 6 refills | Status: DC

## 2024-05-16 NOTE — Progress Notes (Signed)
 Chief Complaint  Patient presents with   New Patient (Initial Visit)    Rm 15, NP with husband Bernardino, tremors in left hand and left jumping   ASSESSMENT AND PLAN  Dana Munoz is a 73 y.o. female   Idiopathic Parkinson's disease  Slow onset, anosmia, excessive movement during sleep, left hand resting tremor, left more than right mild to moderate rigidity, bradykinesia, decreased left arm swing, mild retropulsive instability  MRI of brain  Encourage moderate exercise  Azilect  0.5 mg daily  Return to clinic in 6 months   DIAGNOSTIC DATA (LABS, IMAGING, TESTING) - I reviewed patient records, labs, notes, testing and imaging myself where available.   MEDICAL HISTORY:  Dana Munoz is a 73 year old female, accompanied by her husband, seen in request by her primary care from [place], Lamarr RAMAN, for evaluation of left hand tremor, initial evaluation was on May 16, 2024  History is obtained from the patient and review of electronic medical records. I personally reviewed pertinent available imaging films in PACS.   PMHx of  Allergy Arthritis Obesity  She noticed gradual onset left hand tremor since 2023, gradually getting worse,   Never had good sense of smells, excessive movement, tends to go in her bed, contributed to her joints pain, used to work out at the gym 3 times a week, mainly weight training, did notice mild gait change, is in the process of moving to friend's home independent living,  Her first degree maternal cousin has Parkinson's disease  She denied constipation, denied memory loss   PHYSICAL EXAM:   Vitals:   05/16/24 1354  BP: 138/72  Weight: 246 lb (111.6 kg)  Height: 5' 8 (1.727 m)   Body mass index is 37.4 kg/m.  PHYSICAL EXAMNIATION:  Gen: NAD, conversant, well nourised, well groomed                     Cardiovascular: Regular rate rhythm, no peripheral edema, warm, nontender. Eyes: Conjunctivae clear without exudates or  hemorrhage Neck: Supple, no carotid bruits. Pulmonary: Clear to auscultation bilaterally   NEUROLOGICAL EXAM:  MENTAL STATUS: Speech/cognition: Obese, Awake, alert, oriented to history taking and casual conversation CRANIAL NERVES: CN II: Visual fields are full to confrontation. Pupils are round equal and briskly reactive to light. CN III, IV, VI: extraocular movement are normal. No ptosis. CN V: Facial sensation is intact to light touch CN VII: Face is symmetric with normal eye closure  CN VIII: Hearing is normal to causal conversation. CN IX, X: Phonation is normal. CN XI: Head turning and shoulder shrug are intact  MOTOR: Left hand resting tremor, no weakness, left more than right mild rigidity bradykinesia  REFLEXES: Reflexes are 1 and symmetric at the biceps, triceps, knees, and ankles. Plantar responses are flexor.  SENSORY: Intact to light touch, pinprick and vibratory sensation are intact in fingers and toes.  COORDINATION: There is no trunk or limb dysmetria noted.  GAIT/STANCE: Able to get up from seated position arm crossed, mild retropulsive instability, mildly decreased swing on the left arm, moderate stride  REVIEW OF SYSTEMS:  Full 14 system review of systems performed and notable only for as above All other review of systems were negative.   ALLERGIES: Allergies  Allergen Reactions   Neosporin [Neomycin-Bacitracin Zn-Polymyx]     Blisters    Penicillins Itching    HOME MEDICATIONS: Current Outpatient Medications  Medication Sig Dispense Refill   aspirin 81 MG chewable tablet Chew 81  mg by mouth daily.     cholecalciferol (VITAMIN D) 1000 units tablet Take 1,000 Units by mouth daily.     diphenhydrAMINE (BENADRYL) 25 MG tablet Take 25 mg by mouth every 6 (six) hours as needed for itching.     HYDROcodone -acetaminophen  (NORCO/VICODIN) 5-325 MG tablet Take 1 tablet by mouth every 4 (four) hours as needed. 10 tablet 0   loratadine (CLARITIN) 10 MG tablet  Take 10 mg by mouth daily.     naproxen sodium (ALEVE) 220 MG tablet Take 220 mg by mouth daily as needed. pain     ondansetron  (ZOFRAN  ODT) 4 MG disintegrating tablet Take 1 tablet (4 mg total) by mouth every 8 (eight) hours as needed for nausea or vomiting. 10 tablet 0   No current facility-administered medications for this visit.    PAST MEDICAL HISTORY: Past Medical History:  Diagnosis Date   Graves disease    hyperthyroidism with graves disease   Psoriasis     PAST SURGICAL HISTORY: Past Surgical History:  Procedure Laterality Date   BREAST EXCISIONAL BIOPSY Bilateral    benign   HYSTERECTOMY ABDOMINAL WITH SALPINGECTOMY      FAMILY HISTORY: Family History  Problem Relation Age of Onset   Breast cancer Neg Hx     SOCIAL HISTORY: Social History   Socioeconomic History   Marital status: Married    Spouse name: Not on file   Number of children: Not on file   Years of education: Not on file   Highest education level: Not on file  Occupational History   Not on file  Tobacco Use   Smoking status: Never   Smokeless tobacco: Never  Vaping Use   Vaping status: Not on file  Substance and Sexual Activity   Alcohol use: Yes    Comment: 2-4 weekly   Drug use: No   Sexual activity: Not on file  Other Topics Concern   Not on file  Social History Narrative   Right handed   Caffeine-none   Retired-dental office   Social Drivers of Health   Financial Resource Strain: Not on file  Food Insecurity: Not on file  Transportation Needs: Not on file  Physical Activity: Not on file  Stress: Not on file  Social Connections: Not on file  Intimate Partner Violence: Not on file      Modena Callander, M.D. Ph.D.  [facility] [address], [address] [place], [place] [address] Ph: [phone] Fax: [phone]  CC:  Chrystal Lamarr RAMAN, MD [address] [place],  [place] [address]  Morgan Drivers, MD

## 2024-05-16 NOTE — Telephone Encounter (Signed)
 sent to GI they obtain [facility] Berkley Harvey [phone]

## 2024-05-20 ENCOUNTER — Encounter: Payer: Self-pay | Admitting: Neurology

## 2024-05-31 ENCOUNTER — Ambulatory Visit
Admission: RE | Admit: 2024-05-31 | Discharge: 2024-05-31 | Disposition: A | Discharge status: Home / Self Care | Source: Ambulatory Visit | Attending: Neurology | Admitting: Neurology

## 2024-05-31 DIAGNOSIS — G20A1 Parkinson's disease without dyskinesia, without mention of fluctuations: Secondary | ICD-10-CM | POA: Diagnosis not present

## 2024-06-04 ENCOUNTER — Ambulatory Visit: Payer: Self-pay | Admitting: Neurology

## 2024-08-13 DIAGNOSIS — L400 Psoriasis vulgaris: Secondary | ICD-10-CM | POA: Diagnosis not present

## 2024-08-13 DIAGNOSIS — D2272 Melanocytic nevi of left lower limb, including hip: Secondary | ICD-10-CM | POA: Diagnosis not present

## 2024-08-13 DIAGNOSIS — D485 Neoplasm of uncertain behavior of skin: Secondary | ICD-10-CM | POA: Diagnosis not present

## 2024-08-13 DIAGNOSIS — Z85828 Personal history of other malignant neoplasm of skin: Secondary | ICD-10-CM | POA: Diagnosis not present

## 2024-08-13 DIAGNOSIS — D2372 Other benign neoplasm of skin of left lower limb, including hip: Secondary | ICD-10-CM | POA: Diagnosis not present

## 2024-08-13 DIAGNOSIS — Z23 Encounter for immunization: Secondary | ICD-10-CM | POA: Diagnosis not present

## 2024-08-13 DIAGNOSIS — L821 Other seborrheic keratosis: Secondary | ICD-10-CM | POA: Diagnosis not present

## 2024-08-13 DIAGNOSIS — D225 Melanocytic nevi of trunk: Secondary | ICD-10-CM | POA: Diagnosis not present

## 2024-08-13 DIAGNOSIS — L814 Other melanin hyperpigmentation: Secondary | ICD-10-CM | POA: Diagnosis not present

## 2024-08-13 DIAGNOSIS — L578 Other skin changes due to chronic exposure to nonionizing radiation: Secondary | ICD-10-CM | POA: Diagnosis not present

## 2024-08-13 DIAGNOSIS — C4401 Basal cell carcinoma of skin of lip: Secondary | ICD-10-CM | POA: Diagnosis not present

## 2024-09-23 DIAGNOSIS — H524 Presbyopia: Secondary | ICD-10-CM | POA: Diagnosis not present

## 2024-09-23 DIAGNOSIS — H2513 Age-related nuclear cataract, bilateral: Secondary | ICD-10-CM | POA: Diagnosis not present

## 2024-09-23 DIAGNOSIS — H40013 Open angle with borderline findings, low risk, bilateral: Secondary | ICD-10-CM | POA: Diagnosis not present

## 2024-09-26 DIAGNOSIS — Z1231 Encounter for screening mammogram for malignant neoplasm of breast: Secondary | ICD-10-CM | POA: Diagnosis not present

## 2024-09-26 DIAGNOSIS — R92323 Mammographic fibroglandular density, bilateral breasts: Secondary | ICD-10-CM | POA: Diagnosis not present

## 2024-09-29 ENCOUNTER — Other Ambulatory Visit: Payer: Self-pay

## 2024-09-29 ENCOUNTER — Encounter (HOSPITAL_BASED_OUTPATIENT_CLINIC_OR_DEPARTMENT_OTHER): Payer: Self-pay | Admitting: *Deleted

## 2024-09-29 ENCOUNTER — Emergency Department (HOSPITAL_BASED_OUTPATIENT_CLINIC_OR_DEPARTMENT_OTHER)
Admission: EM | Admit: 2024-09-29 | Discharge: 2024-09-29 | Disposition: A | Discharge status: Home / Self Care | Attending: Emergency Medicine | Admitting: Emergency Medicine

## 2024-09-29 DIAGNOSIS — U071 COVID-19: Secondary | ICD-10-CM | POA: Insufficient documentation

## 2024-09-29 DIAGNOSIS — Z7982 Long term (current) use of aspirin: Secondary | ICD-10-CM | POA: Insufficient documentation

## 2024-09-29 LAB — CBC WITH DIFFERENTIAL/PLATELET
Abs Immature Granulocytes: 0.02 K/uL (ref 0.00–0.07)
Basophils Absolute: 0 K/uL (ref 0.0–0.1)
Basophils Relative: 0 %
Eosinophils Absolute: 0 K/uL (ref 0.0–0.5)
Eosinophils Relative: 0 %
HCT: 39.5 % (ref 36.0–46.0)
Hemoglobin: 13.7 g/dL (ref 12.0–15.0)
Immature Granulocytes: 0 %
Lymphocytes Relative: 6 %
Lymphs Abs: 0.5 K/uL — ABNORMAL LOW (ref 0.7–4.0)
MCH: 29.8 pg (ref 26.0–34.0)
MCHC: 34.7 g/dL (ref 30.0–36.0)
MCV: 85.9 fL (ref 80.0–100.0)
Monocytes Absolute: 0.7 K/uL (ref 0.1–1.0)
Monocytes Relative: 9 %
Neutro Abs: 6.5 K/uL (ref 1.7–7.7)
Neutrophils Relative %: 85 %
Platelets: 173 K/uL (ref 150–400)
RBC: 4.6 MIL/uL (ref 3.87–5.11)
RDW: 12.6 % (ref 11.5–15.5)
WBC: 7.6 K/uL (ref 4.0–10.5)
nRBC: 0 % (ref 0.0–0.2)

## 2024-09-29 LAB — COMPREHENSIVE METABOLIC PANEL WITH GFR
ALT: 11 U/L (ref 0–44)
AST: 26 U/L (ref 15–41)
Albumin: 4.1 g/dL (ref 3.5–5.0)
Alkaline Phosphatase: 118 U/L (ref 38–126)
Anion gap: 12 (ref 5–15)
BUN: 12 mg/dL (ref 8–23)
CO2: 24 mmol/L (ref 22–32)
Calcium: 9.5 mg/dL (ref 8.9–10.3)
Chloride: 99 mmol/L (ref 98–111)
Creatinine, Ser: 0.57 mg/dL (ref 0.44–1.00)
GFR, Estimated: >60 mL/min (ref >60)
Glucose, Bld: 120 mg/dL — ABNORMAL HIGH (ref 70–99)
Potassium: 3.7 mmol/L (ref 3.5–5.1)
Sodium: 136 mmol/L (ref 135–145)
Total Bilirubin: 2.2 mg/dL — ABNORMAL HIGH (ref 0.0–1.2)
Total Protein: 6.8 g/dL (ref 6.5–8.1)

## 2024-09-29 LAB — RESP PANEL BY RT-PCR (RSV, FLU A&B, COVID)  RVPGX2
Influenza A by PCR: NEGATIVE
Influenza B by PCR: NEGATIVE
Resp Syncytial Virus by PCR: NEGATIVE
SARS Coronavirus 2 by RT PCR: POSITIVE — AB

## 2024-09-29 MED ORDER — ONDANSETRON HCL 4 MG/2ML IJ SOLN
4.0000 mg | Freq: Once | INTRAMUSCULAR | Status: AC
  Administered 2024-09-29: 4 mg via INTRAVENOUS
  Filled 2024-09-29: qty 2

## 2024-09-29 MED ORDER — LACTATED RINGERS IV BOLUS
1000.0000 mL | Freq: Once | INTRAVENOUS | Status: AC
  Administered 2024-09-29: 1000 mL via INTRAVENOUS

## 2024-09-29 MED ORDER — PAXLOVID (300/100) 20 X 150 MG & 10 X 100MG PO TBPK: 3.0000 | ORAL_TABLET | Freq: Two times a day (BID) | ORAL | 0 refills | Status: AC

## 2024-09-29 NOTE — ED Triage Notes (Signed)
 Pt to ED reporting fever, headache and dry heaves since last night.

## 2024-09-29 NOTE — ED Provider Notes (Signed)
 [place] [facility] AT [facility] Provider Note   CSN: [id number] Arrival date & time: 09/29/24  9964     Patient presents with: Fever and Emesis   Dana Munoz is a 73 y.o. female.   HPI     This is a 73 year old female who presents with 24-hour history of chills and nausea.  Patient states that she began to feel poorly after dinner on Friday night.  She has had headache and chills.  Reports fever up to 102.  Reports dry heaves without emesis.  No abdominal pain.  Denies urinary symptoms.  No known sick contacts but she did recently move into an assisted living community.  Prior to Admission medications   Medication Sig Start Date End Date Taking? Authorizing Provider  nirmatrelvir/ritonavir (PAXLOVID , 300/100,) 20 x 150 MG & 10 x 100MG  TBPK Take 3 tablets by mouth 2 (two) times daily for 5 days. Patient GFR is 60. Take nirmatrelvir (150 mg) two tablets twice daily for 5 days and ritonavir (100 mg) one tablet twice daily for 5 days. 09/29/24 10/04/24 Yes Deverick Pruss, Charmaine FALCON, MD  ALPRAZolam  (XANAX ) 0.5 MG tablet Take 1-2 tablets 30 minutes prior to MRI, may repeat once as needed. Must have driver. 05/16/24   Onita Duos, MD  aspirin 81 MG chewable tablet Chew 81 mg by mouth daily.    [provider]  cholecalciferol (VITAMIN D) 1000 units tablet Take 1,000 Units by mouth daily.    [provider]  diphenhydrAMINE (BENADRYL) 25 MG tablet Take 25 mg by mouth every 6 (six) hours as needed for itching.    [provider]  HYDROcodone -acetaminophen  (NORCO/VICODIN) 5-325 MG tablet Take 1 tablet by mouth every 4 (four) hours as needed. 03/14/17   Nevelyn Nat Norris, PA-C  loratadine (CLARITIN) 10 MG tablet Take 10 mg by mouth daily.    [provider]  naproxen sodium (ALEVE) 220 MG tablet Take 220 mg by mouth daily as needed. pain    [provider]  ondansetron  (ZOFRAN  ODT) 4 MG disintegrating tablet Take 1 tablet (4 mg  total) by mouth every 8 (eight) hours as needed for nausea or vomiting. 03/14/17   Nevelyn Nat Norris, PA-C  rasagiline  (AZILECT ) 0.5 MG TABS tablet Take 1 tablet (0.5 mg total) by mouth daily. 05/16/24   Onita Duos, MD    Allergies: Neosporin [neomycin-bacitracin zn-polymyx] and Penicillins    Review of Systems  Constitutional:  Positive for chills and fever.  Respiratory:  Negative for chest tightness.   Cardiovascular:  Negative for chest pain.  Gastrointestinal:  Positive for nausea. Negative for abdominal pain.  Neurological:  Positive for headaches.  All other systems reviewed and are negative.   Updated Vital Signs BP 139/68 (BP Location: Left Arm)   Pulse 98   Temp 98 F (36.7 C)   Resp 18   SpO2 93%   Physical Exam Vitals and nursing note reviewed.  Constitutional:      Appearance: She is well-developed. She is obese. She is not ill-appearing.  HENT:     Head: Normocephalic and atraumatic.  Eyes:     Pupils: Pupils are equal, round, and reactive to light.  Cardiovascular:     Rate and Rhythm: Normal rate and regular rhythm.     Heart sounds: Normal heart sounds.  Pulmonary:     Effort: Pulmonary effort is normal. No respiratory distress.     Breath sounds: No wheezing.  Abdominal:     Palpations: Abdomen is soft.  Tenderness: There is no abdominal tenderness. There is no guarding or rebound.  Musculoskeletal:     Cervical back: Neck supple.  Skin:    General: Skin is warm and dry.  Neurological:     Mental Status: She is alert and oriented to person, place, and time.  Psychiatric:        Mood and Affect: Mood normal.     (all labs ordered are listed, but only abnormal results are displayed) Labs Reviewed  RESP PANEL BY RT-PCR (RSV, FLU A&B, COVID)  RVPGX2 - Abnormal; Notable for the following components:      Result Value   SARS Coronavirus 2 by RT PCR POSITIVE (*)    All other components within normal limits  CBC WITH DIFFERENTIAL/PLATELET -  Abnormal; Notable for the following components:   Lymphs Abs 0.5 (*)    All other components within normal limits  COMPREHENSIVE METABOLIC PANEL WITH GFR - Abnormal; Notable for the following components:   Glucose, Bld 120 (*)    Total Bilirubin 2.2 (*)    All other components within normal limits    EKG: None  Radiology: No results found.   Procedures   Medications Ordered in the ED  lactated ringers  bolus 1,000 mL (1,000 mLs Intravenous New Bag/Given 09/29/24 0149)  ondansetron  (ZOFRAN ) injection 4 mg (4 mg Intravenous Given 09/29/24 0149)                                    Medical Decision Making Amount and/or Complexity of Data Reviewed Labs: ordered.  Risk Prescription drug management.   This patient presents to the ED for concern of fever, chills, headache, this involves an extensive number of treatment options, and is a complaint that carries with it a high risk of complications and morbidity.  I considered the following differential and admission for this acute, potentially life threatening condition.  The differential diagnosis includes viral illness such as COVID or influenza, pneumonia, less likely meningitis  MDM:    This is a 73 year old female who presents with fever and upper respiratory symptoms.  Reports headache.  She is nontoxic and vital signs are reassuring.  History of Parkinson's disease.  Patient was given fluids and Zofran .  Positive for COVID-19.  Basic lab workup is reassuring.  Breath sounds are clear and she is in no respiratory distress.  Symptoms likely related to COVID and will hold off on x-ray imaging at this time as she does not have a primary cough.  Discussed with her the risk and benefits of Paxlovid .  She does not appear to have any medication contraindications.  We discussed quarantine and masking precautions for both herself and her husband.  Patient stated understanding.  (Labs, imaging, consults)  Labs: I Ordered, and personally  interpreted labs.  The pertinent results include: The, BMP  Imaging Studies ordered: I ordered imaging studies including none I independently visualized and interpreted imaging. I agree with the radiologist interpretation  Additional history obtained from chart review.  External records from outside source obtained and reviewed including prior evaluations  Cardiac Monitoring: The patient was maintained on a cardiac monitor.  If on the cardiac monitor, I personally viewed and interpreted the cardiac monitored which showed an underlying rhythm of: Sinus  Reevaluation: After the interventions noted above, I reevaluated the patient and found that they have :improved  Social Determinants of Health:  lives with husband  Disposition: Discharge  Co morbidities that complicate the patient evaluation  Past Medical History:  Diagnosis Date   Graves disease    hyperthyroidism with graves disease   Psoriasis      Medicines Meds ordered this encounter  Medications   lactated ringers  bolus 1,000 mL   ondansetron  (ZOFRAN ) injection 4 mg   nirmatrelvir/ritonavir (PAXLOVID , 300/100,) 20 x 150 MG & 10 x 100MG  TBPK    Sig: Take 3 tablets by mouth 2 (two) times daily for 5 days. Patient GFR is 60. Take nirmatrelvir (150 mg) two tablets twice daily for 5 days and ritonavir (100 mg) one tablet twice daily for 5 days.    Dispense:  30 tablet    Refill:  0    I have reviewed the patients home medicines and have made adjustments as needed  Problem List / ED Course: Problem List Items Addressed This Visit   None Visit Diagnoses       COVID-19    -  Primary   Relevant Medications   nirmatrelvir/ritonavir (PAXLOVID , 300/100,) 20 x 150 MG & 10 x 100MG  TBPK                Final diagnoses:  COVID-19    ED Discharge Orders          Ordered    nirmatrelvir/ritonavir (PAXLOVID , 300/100,) 20 x 150 MG & 10 x 100MG  TBPK  2 times daily        09/29/24 0305               Bari Charmaine FALCON, MD 09/29/24 [phone]

## 2024-09-29 NOTE — Discharge Instructions (Addendum)
 You are seen today and tested positive for COVID-19.  Make sure that you are staying hydrated.  Take Tylenol  as needed for fever.  You will be given a prescription for Paxlovid .  Most common side effects are listed below.  You should quarantine until 24 hours fever free.  Then mask given that you are in a community environment for 5 days after symptoms resolved.  The most common side effects of PAXLOVID  include: altered sense of taste (such as metallic, bitter taste) and diarrhea. Other possible side effects include:  headache  vomiting  abdominal pain  nausea  high blood pressure  feeling generally unwell

## 2024-11-19 ENCOUNTER — Encounter: Payer: Self-pay | Admitting: Neurology

## 2024-11-19 ENCOUNTER — Ambulatory Visit: Admitting: Neurology

## 2024-11-19 VITALS — BP 130/84 | HR 82 | Ht 68.0 in | Wt 250.4 lb

## 2024-11-19 DIAGNOSIS — G20A1 Parkinson's disease without dyskinesia, without mention of fluctuations: Secondary | ICD-10-CM | POA: Diagnosis not present

## 2024-11-19 MED ORDER — RASAGILINE MESYLATE 1 MG PO TABS: 1.0000 mg | ORAL_TABLET | Freq: Every day | ORAL | 3 refills | Status: AC

## 2024-11-19 NOTE — Progress Notes (Signed)
 "  Chief Complaint  Patient presents with   Follow-up    Pt in room 15. Alone.Husband in room. Here for Parkinson follow up.   ASSESSMENT AND PLAN  [name] is a 74 y.o. female   Idiopathic Parkinson's disease  Slow onset, anosmia, excessive movement during sleep, left hand resting tremor, left more than right mild to moderate rigidity, bradykinesia, decreased left arm swing, mild retropulsive instability  MRI of brain in August 2025 showed mild small vessel disease  [place], had close contact with multiple pesticides in the past including paraquat  She is tolerating Azilect  0.5 mg daily, will increase to 1 mg daily  We also talk about other medication choices such as add on dopamine agonist, versus starting low-dose of sinemet she prefer not to take too much medicine now  Encourage moderate exercise  Return to clinic in 6 months   DIAGNOSTIC DATA (LABS, IMAGING, TESTING) - I reviewed patient records, labs, notes, testing and imaging myself where available.   MEDICAL HISTORY:  Dana Munoz is a 74 year old female, accompanied by her husband, seen in request by her primary care from [place], Lamarr RAMAN, for evaluation of left hand tremor, initial evaluation was on May 16, 2024  History is obtained from the patient and review of electronic medical records. I personally reviewed pertinent available imaging films in PACS.   PMHx of  Allergy Arthritis Obesity  She noticed gradual onset left hand tremor since 2023, gradually getting worse,   Never had good sense of smells, excessive movement, tends to go in her bed, contributed to her joints pain, used to work out at the gym 3 times a week, mainly weight training, did notice mild gait change, is in the process of moving to friend's home independent living,  Her first degree maternal cousin has Parkinson's disease  She denied constipation, denied memory loss  UPDATE Jan 27th 2026: She is  accompanied by her husband at today's clinical visit, they moved to friend's home since November 2025, independent living, really love current setting   She is more noticeable left hand tremor, sometimes feel left leg is slow, causing mild gait concern, no falling, no limitation in his daily activity  She is tolerating Azilect  0.5 mg well  MRI of the brain from August 2025, mild small vessel disease no acute abnormality    PHYSICAL EXAM:   Vitals:   11/19/24 1111 11/19/24 1118  BP: (!) 147/89 130/84  Pulse: 82   Weight: 250 lb 6.4 oz (113.6 kg)   Height: 5' 8 (1.727 m)    Body mass index is 38.07 kg/m.  PHYSICAL EXAMNIATION:  Gen: NAD, conversant, well nourised, well groomed                     Cardiovascular: Regular rate rhythm, no peripheral edema, warm, nontender. Eyes: Conjunctivae clear without exudates or hemorrhage Neck: Supple, no carotid bruits. Pulmonary: Clear to auscultation bilaterally   NEUROLOGICAL EXAM:  MENTAL STATUS: Speech/cognition: Obese, Awake, alert, oriented to history taking and casual conversation CRANIAL NERVES: CN II: Visual fields are full to confrontation. Pupils are round equal and briskly reactive to light. CN III, IV, VI: extraocular movement are normal. No ptosis. CN V: Facial sensation is intact to light touch CN VII: Face is symmetric with normal eye closure  CN VIII: Hearing is normal to causal conversation. CN IX, X: Phonation is normal. CN XI: Head turning and shoulder shrug are intact  MOTOR: Left hand resting tremor, no weakness, left more than right mild rigidity bradykinesia  REFLEXES: Reflexes are 1 and symmetric at the biceps, triceps, knees, and ankles. Plantar responses are flexor.  SENSORY: Intact to light touch, pinprick and vibratory sensation are intact in fingers and toes.  COORDINATION: There is no trunk or limb dysmetria noted.  GAIT/STANCE: Able to get up from seated position arm crossed, mild retropulsive  instability, mildly decreased swing on the left arm, moderate stride, since turning  REVIEW OF SYSTEMS:  Full 14 system review of systems performed and notable only for as above All other review of systems were negative.   ALLERGIES: Allergies  Allergen Reactions   Latex     Rash    Neosporin [Neomycin-Bacitracin Zn-Polymyx]     Blisters    Penicillins Itching    HOME MEDICATIONS: Current Outpatient Medications  Medication Sig Dispense Refill   cetirizine (ZYRTEC ALLERGY) 10 MG tablet Take 10 mg by mouth daily.     rasagiline  (AZILECT ) 0.5 MG TABS tablet Take 1 tablet (0.5 mg total) by mouth daily. 30 tablet 6   ALPRAZolam  (XANAX ) 0.5 MG tablet Take 1-2 tablets 30 minutes prior to MRI, may repeat once as needed. Must have driver. (Patient not taking: Reported on 11/19/2024) 3 tablet 0   No current facility-administered medications for this visit.    PAST MEDICAL HISTORY: Past Medical History:  Diagnosis Date   Graves disease    hyperthyroidism with graves disease   Psoriasis     PAST SURGICAL HISTORY: Past Surgical History:  Procedure Laterality Date   BREAST EXCISIONAL BIOPSY Bilateral    benign   HYSTERECTOMY ABDOMINAL WITH SALPINGECTOMY      FAMILY HISTORY: Family History  Problem Relation Age of Onset   Breast cancer Neg Hx     SOCIAL HISTORY: Social History   Socioeconomic History   Marital status: Married    Spouse name: Not on file   Number of children: Not on file   Years of education: Not on file   Highest education level: Not on file  Occupational History   Not on file  Tobacco Use   Smoking status: Never   Smokeless tobacco: Never  Vaping Use   Vaping status: Not on file  Substance and Sexual Activity   Alcohol use: Yes    Comment: 2-4 weekly   Drug use: No   Sexual activity: Not on file  Other Topics Concern   Not on file  Social History Narrative   Right handed   Caffeine-none   Retired-dental office   Social Drivers of Health    Tobacco Use: Low Risk (11/19/2024)   Patient History    Smoking Tobacco Use: Never    Smokeless Tobacco Use: Never    Passive Exposure: Not on file  Financial Resource Strain: Not on file  Food Insecurity: Not on file  Transportation Needs: Not on file  Physical Activity: Not on file  Stress: Not on file  Social Connections: Not on file  Intimate Partner Violence: Not on file  Depression (EYV7-0): Not on file  Alcohol Screen: Not on file  Housing: Not on file  Utilities: Not on file  Health Literacy: Not on file      Modena Callander, M.D. Ph.D.  [facility] [address] [place], [place] [address] Ph: [phone] Fax: [phone]  CC:  Morgan Drivers, MD [address] [place],  [place] [address]  Morgan Drivers, MD   "

## 2025-11-19 ENCOUNTER — Ambulatory Visit: Admitting: Neurology
